# Patient Record
Sex: Male | Born: 1970
Health system: Southern US, Community
[De-identification: ages and names within clinical notes are randomized; demographics above are authoritative.]

## PROBLEM LIST (undated history)

## (undated) DIAGNOSIS — Z87442 Personal history of urinary calculi: Secondary | ICD-10-CM

## (undated) DIAGNOSIS — D689 Coagulation defect, unspecified: Secondary | ICD-10-CM

## (undated) HISTORY — PX: EYE SURGERY: SHX253

## (undated) HISTORY — DX: Coagulation defect, unspecified: D68.9

---

## 1994-03-19 HISTORY — PX: EYE SURGERY: SHX253

## 2007-05-03 ENCOUNTER — Emergency Department (HOSPITAL_COMMUNITY): Admission: EM | Admit: 2007-05-03 | Discharge: 2007-05-03 | Payer: Self-pay | Admitting: Family Medicine

## 2007-05-06 ENCOUNTER — Emergency Department (HOSPITAL_COMMUNITY): Admission: EM | Admit: 2007-05-06 | Discharge: 2007-05-06 | Payer: Self-pay | Admitting: Emergency Medicine

## 2007-07-30 ENCOUNTER — Emergency Department (HOSPITAL_COMMUNITY): Admission: EM | Admit: 2007-07-30 | Discharge: 2007-07-30 | Payer: Self-pay | Admitting: Family Medicine

## 2010-12-08 LAB — I-STAT 8, (EC8 V) (CONVERTED LAB)
Chloride: 107
Glucose, Bld: 105 — ABNORMAL HIGH
HCT: 45
pCO2, Ven: 38 — ABNORMAL LOW
pH, Ven: 7.422 — ABNORMAL HIGH

## 2010-12-08 LAB — CULTURE, ROUTINE-ABSCESS: Gram Stain: NONE SEEN

## 2010-12-08 LAB — DIFFERENTIAL
Eosinophils Absolute: 0.1
Lymphocytes Relative: 40
Lymphs Abs: 3.1
Neutrophils Relative %: 51

## 2010-12-08 LAB — POCT I-STAT CREATININE: Creatinine, Ser: 1

## 2010-12-08 LAB — CBC
Platelets: 242
WBC: 7.7

## 2011-06-04 ENCOUNTER — Telehealth: Payer: Self-pay

## 2011-06-04 NOTE — Telephone Encounter (Signed)
Patient saw Dr. Milus Glazier a few months ago, received some suggestions from him as far as treatment concerned. Was told to call Dr. Elbert Ewings. back and he would further advise him. Requests call from Dr. Elbert Ewings.

## 2011-06-04 NOTE — Telephone Encounter (Signed)
Pt states that you could give him something else to help jump off wt loss.  He was taking the Wellbutrin and he felt that it did not work for him.  Please advise.  Chart is in your box in Dr lounge if you needed.

## 2011-06-05 NOTE — Telephone Encounter (Signed)
I don't have a chart on him recently for weight loss.  Please pull his written chart

## 2011-06-08 NOTE — Telephone Encounter (Signed)
CHART IS IN YOUR BOX IN THE DR [place]

## 2011-08-08 ENCOUNTER — Ambulatory Visit: Payer: Self-pay | Admitting: Family Medicine

## 2011-08-08 VITALS — BP 124/90 | HR 68 | Temp 98.5°F | Resp 18 | Ht 71.25 in | Wt 309.8 lb

## 2011-08-08 DIAGNOSIS — I10 Essential (primary) hypertension: Secondary | ICD-10-CM

## 2011-08-08 MED ORDER — PROPRANOLOL HCL 20 MG PO TABS: 20.0000 mg | ORAL_TABLET | Freq: Two times a day (BID) | ORAL | Status: DC

## 2011-08-08 NOTE — Progress Notes (Signed)
  Patient Name: Steven Hudson Date of Birth: 1971-02-08 Medical Record Number: [id number] Gender: male Date of Encounter: 08/08/2011  History of Present Illness:  JJESUS DINGLEY is a 41 y.o. very pleasant male patient who presents with the following:  Here with concern about his BP.  He has noted that she sometimes feels flushed and nervous- can have some panic type symptoms.  When he feels this way he will check his BP and note that it may be 150/ 95- 100.  When he is able to settle down and feels better his BP will return to normal.   No exertional problems or symptoms- had an episode today while he was driving though.   He notes symptoms every few days to every couple of days.  Worse recently due to stress he thinks.  Notes increased stress at work recently.   He did take a xanax a couple of weeks ago and felt better.   No CP or palpitations, no CAD history.  He does not feel depressed or notice other severe symptoms of anxiety.  Mother with history of HTN.  Brother with HTN.    There is no problem list on file for this patient.  No past medical history on file. No past surgical history on file. History  Substance Use Topics  . Smoking status: Never Smoker   . Smokeless tobacco: Former Neurosurgeon    Types: Chew  . Alcohol Use: Not on file   No family history on file. Allergies no known allergies  Medication list has been reviewed and updated.  Review of Systems: As per HPI- otherwise negative.   Physical Examination: Filed Vitals:   08/08/11 1750 08/08/11 1800  BP: 120/80 124/90  Pulse: 68   Temp: 98.5 F (36.9 C)   TempSrc: Oral   Resp: 18   Height: 5' 11.25" (1.81 m)   Weight: 309 lb 12.8 oz (140.524 kg)   SpO2: 98%     Body mass index is 42.91 kg/(m^2).  GEN: WDWN, NAD, Non-toxic, A & O x 3, obese HEENT: Atraumatic, Normocephalic. Neck supple. No masses, No LAD. Ears and Nose: No external deformity. CV: RRR, No M/G/R. No JVD. No thrill. No extra heart  sounds. PULM: CTA B, no wheezes, crackles, rhonchi. No retractions. No resp. distress. No accessory muscle use. ABD: S, NT, ND, +BS. No rebound. No HSM. EXTR: No c/c/e NEURO Normal gait.  PSYCH: Normally interactive. Conversant. Not depressed or anxious appearing.  Calm demeanor.   EKG: NSR, no ST elevation or depression.  No change from older EKG  Assessment and Plan: 1. HTN (hypertension)  propranolol (INDERAL) 20 MG tablet   Will try a low dose of interal to see if it might help with likely anxiety- related BP changes and ?panic attacks.  He will let us know how this does by phone in a few days- Sooner if worse.   If his symptoms persist we can do further labs, TSH, etc- for now he wishes to delay as his health insurance is in flux.  If he develops any CP or other worrisome symptoms get help right away

## 2011-08-10 ENCOUNTER — Encounter: Payer: Self-pay | Admitting: Family Medicine

## 2012-01-24 ENCOUNTER — Ambulatory Visit (INDEPENDENT_AMBULATORY_CARE_PROVIDER_SITE_OTHER): Payer: BC Managed Care – PPO | Admitting: Family Medicine

## 2012-01-24 VITALS — BP 130/92 | HR 101 | Temp 98.2°F | Resp 18 | Ht 71.5 in | Wt 309.8 lb

## 2012-01-24 DIAGNOSIS — F329 Major depressive disorder, single episode, unspecified: Secondary | ICD-10-CM

## 2012-01-24 DIAGNOSIS — H00019 Hordeolum externum unspecified eye, unspecified eyelid: Secondary | ICD-10-CM

## 2012-01-24 MED ORDER — POLYMYXIN B-TRIMETHOPRIM 10000-0.1 UNIT/ML-% OP SOLN: 1.0000 [drp] | OPHTHALMIC | Status: DC

## 2012-01-24 MED ORDER — BUPROPION HCL ER (SR) 150 MG PO TB12: 150.0000 mg | ORAL_TABLET | Freq: Two times a day (BID) | ORAL | Status: DC

## 2012-01-24 NOTE — Patient Instructions (Addendum)
Apply hot compresses to your eye frequently. If it is not better in the next couple of days let me know- Sooner if worse.   Take the wellbutrin just once a day for 3 days- then twice a day.  Give me a call if you do not feel that it is helpful for you

## 2012-01-24 NOTE — Progress Notes (Signed)
[facility] [address] [place] [address] [phone]  Date:  01/24/2012   Name:  Steven Hudson   DOB:  [birth year]   MRN:  [id number]  PCP:  Tally Due, MD    Chief Complaint: Eye Pain   History of Present Illness:  Steven Hudson is a 41 y.o. very pleasant male patient who presents with the following:  He has noted some pain and swelling in his right lower eye lid- this has been present for about 3 days. He now notes a bump and a "pus head." It will drain some and leave some crust in the am.  Vision seems to be ok and he does not have any pain in the globe.  He did have a right eye injury in the 1997- a piece of wire hurt his eye.  He had surgery and recovered.   He does not wear corrective lenses.  Otherwise he is feeling ok.    He tried wellbutrin for a couple of weeks in the past- it did not seem to make any difference but he might like to start back on this. He thinks maybe he did not take enough or take it for enough time.  He does not feel depressed but also thinks that his mood is not as good as it could be.  He can feel unmotivated and have a hard time focusing.  No SI.   His mother died earlier this year and he is still mourning her loss fairly acutely.    He is not taking inderall any more- he no longer needs it except for very occasionally.  There is no problem list on file for this patient.   History reviewed. No pertinent past medical history.  Past Surgical History  Procedure Date  . Eye surgery     History  Substance Use Topics  . Smoking status: Never Smoker   . Smokeless tobacco: Former Neurosurgeon    Types: Chew  . Alcohol Use: Not on file    Family History  Problem Relation Age of Onset  . Hypertension Mother   . Leukemia Mother   . Diabetes Father   . Heart disease Maternal Grandmother   . Heart disease Maternal Grandfather   . Diabetes Paternal Grandfather     No Known Allergies  Medication list has been reviewed  and updated.  Current Outpatient Prescriptions on File Prior to Visit  Medication Sig Dispense Refill  . propranolol (INDERAL) 20 MG tablet Take 1 tablet (20 mg total) by mouth 2 (two) times daily.  60 tablet  1    Review of Systems:  As per HPI- otherwise negative.   Physical Examination: Filed Vitals:   01/24/12 1413  BP: 130/92  Pulse: 101  Temp: 98.2 F (36.8 C)  Resp: 18   Filed Vitals:   01/24/12 1413  Height: 5' 11.5" (1.816 m)  Weight: 309 lb 12.8 oz (140.524 kg)   Body mass index is 42.61 kg/(m^2). Ideal Body Weight: Weight in (lb) to have BMI = 25: 181.4   GEN: WDWN, NAD, Non-toxic, A & O x 3, obese HEENT: Atraumatic, Normocephalic. Neck supple. No masses, No LAD. Bilateral TM wnl, oropharynx normal.  PEERL,EOMI.  Right lower lid has a visible stye.  Position does not allow for pus to be expressed.    Fundoscopic exam wnl.  Conjunctivae benign.  Ears and Nose: No external deformity. CV: RRR, No M/G/R. No JVD. No thrill. No extra heart sounds.  PULM: CTA B, no wheezes, crackles, rhonchi. No retractions. No resp. distress. No accessory muscle use. EXTR: No c/c/e NEURO Normal gait.  PSYCH: Normally interactive. Conversant. Not depressed or anxious appearing.  Calm demeanor.    Assessment and Plan: 1. Stye  trimethoprim-polymyxin b (POLYTRIM) ophthalmic solution  2. Depression  buPROPion (WELLBUTRIN SR) 150 MG 12 hr tablet   Aggressive hot compresses and polytrim drops as he has noted some am crusting of his eye as well.   Will try wellbutrin again- he will take 150 mg but try increasing to BID after a few days.   See patient instructions for more details.     Abbe Amsterdam, MD

## 2012-01-28 NOTE — Progress Notes (Signed)
Completed prior auth for pt's bupropion SR 150 BID over the phone and received approval from 12/29/11 - 01/27/13. Faxed approval notice to pharmacy.

## 2012-02-05 ENCOUNTER — Telehealth: Payer: Self-pay

## 2012-02-05 NOTE — Telephone Encounter (Signed)
Left message for him, to call me back about this, or call the pharmacy to see what he was previously taking. He is advised to call me back.

## 2012-02-05 NOTE — Telephone Encounter (Signed)
Pt is concerned and would like to know if the rx for welbutrin is correct he was prescribed xl 150 mg when he saw Dr L and just recently Dr. Patsy Lager did rx for sr 150mg  he wants to make sure he taking the right dosage of medication because the pills look totally different. [phone]

## 2012-02-07 NOTE — Telephone Encounter (Signed)
Left message for him to call me back if the pharmacy has not taken care of this.

## 2012-05-20 ENCOUNTER — Telehealth: Payer: Self-pay

## 2012-05-20 NOTE — Telephone Encounter (Signed)
PATIENT WANTS THIS MESSAGE TO GO TO DR. Patsy Lager. SHE PRESCRIBED HIM WELLBUTRIN TO CALM HIM DOWN. HE STATES IT IS NOT WORKING FOR HIM. HE SAID HE HAS GONE TO COUNSELING, AND THE DOCTOR SUGGESTED HE CALL BACK HERE TO GET HIS MEDICINE CHANGED TO SOMETHING ELSE. BEST PHONE [phone] (CELL)   PHARMACY CHOICE IS [facility] ON [address].   MBC

## 2012-05-21 NOTE — Telephone Encounter (Signed)
Ok, I am glad he has been going to counseling, as I have not seen him since November please ask him to come and see me this week- I will be in Thursday and Friday if one of those days would work for him

## 2012-05-21 NOTE — Telephone Encounter (Signed)
Patient will follow up either Thursday or Friday with you.

## 2012-05-22 ENCOUNTER — Ambulatory Visit (INDEPENDENT_AMBULATORY_CARE_PROVIDER_SITE_OTHER): Payer: BC Managed Care – PPO | Admitting: Family Medicine

## 2012-05-22 VITALS — BP 130/90 | HR 65 | Temp 97.9°F | Resp 16 | Ht 71.5 in | Wt 316.0 lb

## 2012-05-22 DIAGNOSIS — F909 Attention-deficit hyperactivity disorder, unspecified type: Secondary | ICD-10-CM

## 2012-05-22 LAB — COMPREHENSIVE METABOLIC PANEL
ALT: 32 U/L (ref 0–53)
AST: 24 U/L (ref 0–37)
Albumin: 4.7 g/dL (ref 3.5–5.2)
BUN: 12 mg/dL (ref 6–23)
Calcium: 9.8 mg/dL (ref 8.4–10.5)
Chloride: 101 mEq/L (ref 96–112)
Potassium: 4.3 mEq/L (ref 3.5–5.3)
Sodium: 140 mEq/L (ref 135–145)
Total Protein: 7.4 g/dL (ref 6.0–8.3)

## 2012-05-22 LAB — TSH: TSH: 2.131 u[IU]/mL (ref 0.350–4.500)

## 2012-05-22 MED ORDER — AMPHETAMINE-DEXTROAMPHETAMINE 5 MG PO TABS: 5.0000 mg | ORAL_TABLET | Freq: Two times a day (BID) | ORAL | Status: DC

## 2012-05-22 NOTE — Patient Instructions (Addendum)
We are going to start adderall 5mg  twice a day.  Keep an eye on your symptoms, as well as your blood pressure and appetite/ sleep.  If your blood pressure starts going above 140/90 please let me know.  Otherwise, please contact me in 7 to 10 days with an update, and we can increase your adderall if needed

## 2012-05-22 NOTE — Progress Notes (Signed)
[facility] [address], [place] [address] [phone]  Date:  05/22/2012   Name:  Steven Hudson   DOB:  Dec 21, 1970   MRN:  [id number]  PCP:  Tally Due, MD    Chief Complaint: Medicine changes   History of Present Illness:  Steven Hudson is a 42 y.o. very pleasant male patient who presents with the following:  He is here to recheck depression.  I saw him in November of last year and we started wellbutrin- he was mourning his mother at that time.  I was called regarding continued depression, but he states he is not actually depressed anymore- his therapist said he might have adult ADHD and suggested adderall.    He has not been diagnosed with or treated for ADD in the past.  His son does take adderall, and he notes similar behaviors in himself.  He notes that it is hard for him to get things done in a timely manner, he tends to put things off.  He sometimes does not meet his goals at work and feels he does not perform up to his potential.  At home he has a hard time getting focused on tasks and completing tasks.  He did "okay" in school, but "did not pay attention well."    He is sleeping ok, his appetite is ok- "probably too good."  He would like to try a diet of some sort.  He would like to lose some weight  He has not taken wellburtin in a week or so.  He feels like doing things, does not have anhedonia.  He does not feel that he is depressed, "my mood is good, I'm a happy guy"  There is no problem list on file for this patient.   History reviewed. No pertinent past medical history.  Past Surgical History  Procedure Laterality Date  . Eye surgery      History  Substance Use Topics  . Smoking status: Never Smoker   . Smokeless tobacco: Former Neurosurgeon    Types: Chew  . Alcohol Use: Yes    Family History  Problem Relation Age of Onset  . Hypertension Mother   . Leukemia Mother   . Diabetes Father   . Heart disease Maternal Grandmother    . Heart disease Maternal Grandfather   . Diabetes Paternal Grandfather     No Known Allergies  Medication list has been reviewed and updated.  Current Outpatient Prescriptions on File Prior to Visit  Medication Sig Dispense Refill  . buPROPion (WELLBUTRIN SR) 150 MG 12 hr tablet Take 1 tablet (150 mg total) by mouth 2 (two) times daily.  60 tablet  6  . propranolol (INDERAL) 20 MG tablet Take 1 tablet (20 mg total) by mouth 2 (two) times daily.  60 tablet  1  . trimethoprim-polymyxin b (POLYTRIM) ophthalmic solution Place 1 drop into the right eye every 4 (four) hours.  10 mL  0   No current facility-administered medications on file prior to visit.    Review of Systems:  As per HPI- otherwise negative.   Physical Examination: Filed Vitals:   05/22/12 1142  BP: 140/70  Pulse: 65  Temp: 97.9 F (36.6 C)  Resp: 16   Filed Vitals:   05/22/12 1142  Height: 5' 11.5" (1.816 m)  Weight: 316 lb (143.337 kg)   Body mass index is 43.46 kg/(m^2). Ideal Body Weight: Weight in (lb) to have BMI = 25: 181.4  GEN: WDWN, NAD, Non-toxic, A & O x 3, obese HEENT: Atraumatic, Normocephalic. Neck supple. No masses, No LAD. Ears and Nose: No external deformity. CV: RRR, No M/G/R. No JVD. No thrill. No extra heart sounds. PULM: CTA B, no wheezes, crackles, rhonchi. No retractions. No resp. distress. No accessory muscle use. EXTR: No c/c/e NEURO Normal gait.  PSYCH: Normally interactive. Conversant. Not depressed or anxious appearing.  Calm demeanor.   [name] completed the adult ADHD questionnaire today- his score was 41 which is well into the "likely to have ADHD" category Assessment and Plan: Fatigue - Plan: TSH, Comprehensive metabolic panel  Adult ADHD - Plan: amphetamine-dextroamphetamine (ADDERALL) 5 MG tablet  [name] is here today with concern regariding ADHD.  His symptoms and ADHD survery are consistent with ADHD, and I think he is at low risk of abusing stimulants.  Our main  concern is his BP, which is borderline but stable from his visit in the fall.  We will cautiously start a low dose of adderall and see how he does.  He has a home BP cuff and will watch his BP.  Gave an rx for 120 adderall 5mg , as I suspect we will go up to 10 mg BID.  He will start with 5mg  qd or BID, per his preferance.    Abbe Amsterdam, MD

## 2012-05-23 ENCOUNTER — Encounter: Payer: Self-pay | Admitting: Family Medicine

## 2012-05-24 ENCOUNTER — Telehealth: Payer: Self-pay

## 2012-05-24 NOTE — Telephone Encounter (Signed)
PT SAW [place] FOR A REFILL ON ADDERALL ON 05-22-12.  THE PHARMACY HAS SENT A REQUEST FOR A PRE AUTHORIZATION ON THE REFILL TWICE AND HAS NOT HEARD FROM [place].  THE PT IS GOING OUT OF TOWN TOMORROW AND NEEDS TO GET THIS REFILLED BEFORE THEN.  PLEASE CALL [phone]

## 2012-05-26 NOTE — Telephone Encounter (Signed)
Patient is requesting a TSH #3 be added to his labs for testing   [phone]

## 2012-05-26 NOTE — Telephone Encounter (Signed)
x

## 2012-05-28 NOTE — Progress Notes (Signed)
Completed a prior auth for pt's Adderall 5 mg and received approval through 05/28/13. Faxed approval notice to pharmacy.

## 2013-07-28 ENCOUNTER — Telehealth: Payer: Self-pay

## 2013-07-28 NOTE — Telephone Encounter (Signed)
Pt called, has an expires prescription for amphetamine salts , and would like to know if Dr. Patsy Lageropland could authorize new prescription.  [phone]

## 2013-07-28 NOTE — Telephone Encounter (Signed)
Called and LMOM.   I will be in the office the next 3 days- if he can bring in the expired rx I will see what I can do.

## 2013-07-29 ENCOUNTER — Other Ambulatory Visit: Payer: Self-pay | Admitting: Family Medicine

## 2013-07-29 ENCOUNTER — Encounter: Payer: Self-pay | Admitting: Family Medicine

## 2013-07-29 ENCOUNTER — Ambulatory Visit (INDEPENDENT_AMBULATORY_CARE_PROVIDER_SITE_OTHER): Payer: BC Managed Care – PPO | Admitting: Family Medicine

## 2013-07-29 VITALS — BP 118/72 | HR 93 | Temp 98.1°F | Resp 18

## 2013-07-29 DIAGNOSIS — F909 Attention-deficit hyperactivity disorder, unspecified type: Secondary | ICD-10-CM

## 2013-07-29 DIAGNOSIS — F988 Other specified behavioral and emotional disorders with onset usually occurring in childhood and adolescence: Secondary | ICD-10-CM

## 2013-07-29 MED ORDER — AMPHETAMINE-DEXTROAMPHETAMINE 5 MG PO TABS: 5.0000 mg | ORAL_TABLET | Freq: Two times a day (BID) | ORAL | Status: DC

## 2013-07-29 NOTE — Progress Notes (Signed)
[facility] [address], [place] [address] [phone]  Date:  07/29/2013   Name: [facility] Steven EthJason E Mcwilliams   DOB:  [birth year]   MRN:  [id number]  PCP:  Tally DueGUEST, CHRIS WARREN, MD    Chief Complaint: No chief complaint on file.   History of Present Illness:  Steven Hudson is a 43 y.o. very pleasant male patient who presents with the following:  Here today to discuss his ADHD. He was seen for this just over a year ago and we started on adderall 5mg  twice a day.  This seemed to be working well for him, but he eventually stopped taking it and his medication is now expired.  He is here today because he would like to start back on this medication.  He did not notice any adverse effects and did feel that it helped him to concentrate, especially on his tasks at work  Patient Active Problem List   Diagnosis Date Noted  . Obesity, unspecified 05/22/2012  . Adult ADHD 05/22/2012    History reviewed. No pertinent past medical history.  Past Surgical History  Procedure Laterality Date  . Eye surgery      History  Substance Use Topics  . Smoking status: Never Smoker   . Smokeless tobacco: Former NeurosurgeonUser    Types: Chew  . Alcohol Use: Yes    Family History  Problem Relation Age of Onset  . Hypertension Mother   . Leukemia Mother   . Diabetes Father   . Heart disease Maternal Grandmother   . Heart disease Maternal Grandfather   . Diabetes Paternal Grandfather     No Known Allergies  Medication list has been reviewed and updated.  Current Outpatient Prescriptions on File Prior to Visit  Medication Sig Dispense Refill  . amphetamine-dextroamphetamine (ADDERALL) 5 MG tablet Take 1 tablet (5 mg total) by mouth 2 (two) times daily. To fill 08/27/13  60 tablet  0  . trimethoprim-polymyxin b (POLYTRIM) ophthalmic solution Place 1 drop into the right eye every 4 (four) hours.  10 mL  0   No current facility-administered medications on file prior to visit.    Review of  Systems:  As per HPI- otherwise negative.   Physical Examination: Filed Vitals:   07/29/13 1029  BP: 118/72  Pulse: 93  Temp: 98.1 F (36.7 C)  Resp: 18   There were no vitals filed for this visit. There is no weight on file to calculate BMI. Ideal Body Weight:    GEN: WDWN, NAD, Non-toxic, A & O x 3, obese, looks well HEENT: Atraumatic, Normocephalic. Neck supple. No masses, No LAD. Ears and Nose: No external deformity. CV: RRR, No M/G/R. No JVD. No thrill. No extra heart sounds. PULM: CTA B, no wheezes, crackles, rhonchi. No retractions. No resp. distress. No accessory muscle use. EXTR: No c/c/e NEURO Normal gait.  PSYCH: Normally interactive. Conversant. Not depressed or anxious appearing.  Calm demeanor.    Assessment and Plan: ADD (attention deficit disorder)  Given 2 months of adderall 5mg  BID today He will let me know when he needs more or if he has any other problems in the meantime   Signed Abbe AmsterdamJessica Trennon Torbeck, MD

## 2013-09-21 ENCOUNTER — Ambulatory Visit (INDEPENDENT_AMBULATORY_CARE_PROVIDER_SITE_OTHER): Payer: BC Managed Care – PPO | Admitting: Family Medicine

## 2013-09-21 ENCOUNTER — Encounter: Payer: Self-pay | Admitting: Family Medicine

## 2013-09-21 VITALS — BP 118/88 | HR 92 | Temp 98.6°F | Resp 16 | Ht 71.5 in | Wt 302.4 lb

## 2013-09-21 DIAGNOSIS — E785 Hyperlipidemia, unspecified: Secondary | ICD-10-CM

## 2013-09-21 DIAGNOSIS — R42 Dizziness and giddiness: Secondary | ICD-10-CM

## 2013-09-21 DIAGNOSIS — Z1329 Encounter for screening for other suspected endocrine disorder: Secondary | ICD-10-CM

## 2013-09-21 DIAGNOSIS — L659 Nonscarring hair loss, unspecified: Secondary | ICD-10-CM

## 2013-09-21 DIAGNOSIS — R17 Unspecified jaundice: Secondary | ICD-10-CM

## 2013-09-21 LAB — COMPREHENSIVE METABOLIC PANEL
ALT: 34 U/L (ref 0–53)
AST: 26 U/L (ref 0–37)
Albumin: 4.7 g/dL (ref 3.5–5.2)
Alkaline Phosphatase: 57 U/L (ref 39–117)
BUN: 13 mg/dL (ref 6–23)
CALCIUM: 9.7 mg/dL (ref 8.4–10.5)
CHLORIDE: 101 meq/L (ref 96–112)
CO2: 25 meq/L (ref 19–32)
CREATININE: 0.95 mg/dL (ref 0.50–1.35)
GLUCOSE: 91 mg/dL (ref 70–99)
Potassium: 4.2 mEq/L (ref 3.5–5.3)
Sodium: 138 mEq/L (ref 135–145)
Total Bilirubin: 1 mg/dL (ref 0.2–1.2)
Total Protein: 7.5 g/dL (ref 6.0–8.3)

## 2013-09-21 LAB — CBC
HEMATOCRIT: 44.5 % (ref 39.0–52.0)
HEMOGLOBIN: 16 g/dL (ref 13.0–17.0)
MCH: 29.5 pg (ref 26.0–34.0)
MCHC: 36 g/dL (ref 30.0–36.0)
MCV: 82 fL (ref 78.0–100.0)
Platelets: 247 10*3/uL (ref 150–400)
RBC: 5.43 MIL/uL (ref 4.22–5.81)
RDW: 13.2 % (ref 11.5–15.5)
WBC: 7.4 10*3/uL (ref 4.0–10.5)

## 2013-09-21 LAB — LIPID PANEL
CHOLESTEROL: 230 mg/dL — AB (ref 0–200)
HDL: 54 mg/dL (ref >39)
LDL Cholesterol: 158 mg/dL — ABNORMAL HIGH (ref 0–99)
TRIGLYCERIDES: 90 mg/dL (ref <150)
Total CHOL/HDL Ratio: 4.3 Ratio
VLDL: 18 mg/dL (ref 0–40)

## 2013-09-21 LAB — T3, FREE: T3 FREE: 3.4 pg/mL (ref 2.3–4.2)

## 2013-09-21 LAB — TSH: TSH: 1.85 u[IU]/mL (ref 0.350–4.500)

## 2013-09-21 NOTE — Progress Notes (Addendum)
[facility] [address], [place] [address] [phone]  Date:  09/21/2013   Name:  Steven Hudson   DOB:  December 25, 1970   MRN:  [id number]  PCP:  Tally DueGUEST, CHRIS WARREN, MD    Chief Complaint: yellowing of the tongue x 6 mos and labs   History of Present Illness:  Steven EthJason E Pamintuan is a 43 y.o. very pleasant male patient who presents with the following:  Here today with a concern regarding his tongue.  His wife is here with him today  He has noted a yellow color of his tongue that comes and goes.  Also, sometimes his eyes will appear yellow.  They have noted this for about 6 months, it may occur "constantly," but will wax and wane.   His wife does feel that he looks yellow at times- however she is not sure if this just is his complexion.    He is NOT fasting today but would like to do some labs, would like a cholesterol screen He also has also notes some unusual symptoms on occasion when he presses on his head (like uses his head to get out of bed), or when he extends his neck all the way.  This just occurs sporadically and he is not sure if he should be concerned.  No numbness or weakness in his body  He also notes some loss of leg hair on his bilateral lower legs- not sure if this is just due to wearing long pants  Patient Active Problem List   Diagnosis Date Noted  . Obesity, unspecified 05/22/2012  . Adult ADHD 05/22/2012    No past medical history on file.  Past Surgical History  Procedure Laterality Date  . Eye surgery      History  Substance Use Topics  . Smoking status: Never Smoker   . Smokeless tobacco: Former NeurosurgeonUser    Types: Chew  . Alcohol Use: Yes    Family History  Problem Relation Age of Onset  . Hypertension Mother   . Leukemia Mother   . Diabetes Father   . Heart disease Maternal Grandmother   . Heart disease Maternal Grandfather   . Diabetes Paternal Grandfather     No Known Allergies  Medication list has been reviewed and  updated.  Current Outpatient Prescriptions on File Prior to Visit  Medication Sig Dispense Refill  . amphetamine-dextroamphetamine (ADDERALL) 5 MG tablet Take 1 tablet (5 mg total) by mouth 2 (two) times daily. To fill 08/27/13  60 tablet  0  . trimethoprim-polymyxin b (POLYTRIM) ophthalmic solution Place 1 drop into the right eye every 4 (four) hours.  10 mL  0   No current facility-administered medications on file prior to visit.    Review of Systems:  As per HPI- otherwise negative.   Physical Examination: Filed Vitals:   09/21/13 1022  BP: 118/88  Pulse: 92  Temp: 98.6 F (37 C)  Resp: 16   Filed Vitals:   09/21/13 1022  Height: 5' 11.5" (1.816 m)  Weight: 302 lb 6.4 oz (137.168 kg)   Body mass index is 41.59 kg/(m^2). Ideal Body Weight: Weight in (lb) to have BMI = 25: 181.4  GEN: WDWN, NAD, Non-toxic, A & O x 3, obese, looks well HEENT: Atraumatic, Normocephalic. Neck supple. No masses, No LAD. Bilateral TM wnl, oropharynx normal.  PEERL,EOMI.  He has slight build- up on his tongue, but no true jaundice noted.  He does have a more yellow/  golden skin tone on the tanned areas.  However the untanned skin on his abdomen appears normal, pinkish undertones. I do not see appreciable jaundice of his eyes Ears and Nose: No external deformity. CV: RRR, No M/G/R. No JVD. No thrill. No extra heart sounds. PULM: CTA B, no wheezes, crackles, rhonchi. No retractions. No resp. distress. No accessory muscle use. ABD: S, NT, ND EXTR: No c/c/e NEURO Normal gait.  PSYCH: Normally interactive. Conversant. Not depressed or anxious appearing.  Calm demeanor.  He has loss of leg hair on the bilateral lateal shins.  Normal perfusion and hair on feet, strong pedal pulses  Assessment and Plan: Jaundice - Plan: CBC, Comprehensive metabolic panel  Screening for hypothyroidism - Plan: TSH, T3, Free  Other and unspecified hyperlipidemia - Plan: Lipid panel  Patchy loss of hair - Plan: TSH,  T3, Free  Vertigo - Plan: US Carotid Duplex Bilateral  Uncertain if pt truly has had jaundice, will check labs as above Other labs pending Refer for a carotid doppler due to strange sx with neck extension   Signed Abbe AmsterdamJessica Eleesha Purkey, MD  7/7: received labs, called pt and LMOM- no evidence of hepatic dysfunction.  Will send letter Placed order for a future liver function panel- he will try to come in to have this done the next time he seems to be more jaundiced.     Results for orders placed in visit on 09/21/13  CBC      Result Value Ref Range   WBC 7.4  4.0 - 10.5 K/uL   RBC 5.43  4.22 - 5.81 MIL/uL   Hemoglobin 16.0  13.0 - 17.0 g/dL   HCT 29.544.5  62.139.0 - 30.852.0 %   MCV 82.0  78.0 - 100.0 fL   MCH 29.5  26.0 - 34.0 pg   MCHC 36.0  30.0 - 36.0 g/dL   RDW 65.713.2  84.611.5 - 96.215.5 %   Platelets 247  150 - 400 K/uL  COMPREHENSIVE METABOLIC PANEL      Result Value Ref Range   Sodium 138  135 - 145 mEq/L   Potassium 4.2  3.5 - 5.3 mEq/L   Chloride 101  96 - 112 mEq/L   CO2 25  19 - 32 mEq/L   Glucose, Bld 91  70 - 99 mg/dL   BUN 13  6 - 23 mg/dL   Creat 9.520.95  8.410.50 - 3.241.35 mg/dL   Total Bilirubin 1.0  0.2 - 1.2 mg/dL   Alkaline Phosphatase 57  39 - 117 U/L   AST 26  0 - 37 U/L   ALT 34  0 - 53 U/L   Total Protein 7.5  6.0 - 8.3 g/dL   Albumin 4.7  3.5 - 5.2 g/dL   Calcium 9.7  8.4 - 40.110.5 mg/dL  TSH      Result Value Ref Range   TSH 1.850  0.350 - 4.500 uIU/mL  LIPID PANEL      Result Value Ref Range   Cholesterol 230 (*) 0 - 200 mg/dL   Triglycerides 90  <027<150 mg/dL   HDL 54  >25>39 mg/dL   Total CHOL/HDL Ratio 4.3     VLDL 18  0 - 40 mg/dL   LDL Cholesterol 366158 (*) 0 - 99 mg/dL  T3, FREE      Result Value Ref Range   T3, Free 3.4  2.3 - 4.2 pg/mL

## 2013-09-21 NOTE — Patient Instructions (Signed)
We will set up an ultrasound of your carotid arteries to rule- out any blockage there.  Otherwise unless the unusual feeling you have noted in your neck get worse or more persistent I would just continue to keep an eye on this. I will be in touch with your labs asap

## 2013-09-22 ENCOUNTER — Encounter: Payer: Self-pay | Admitting: Family Medicine

## 2013-09-22 NOTE — Addendum Note (Signed)
Addended by: Abbe AmsterdamOPLAND, Valarie Farace C on: 09/22/2013 11:42 AM   Modules accepted: Orders

## 2014-11-23 ENCOUNTER — Encounter: Payer: Self-pay | Admitting: Family Medicine

## 2016-01-06 DIAGNOSIS — R05 Cough: Secondary | ICD-10-CM | POA: Diagnosis not present

## 2016-01-06 DIAGNOSIS — J0100 Acute maxillary sinusitis, unspecified: Secondary | ICD-10-CM | POA: Diagnosis not present

## 2016-03-14 DIAGNOSIS — J029 Acute pharyngitis, unspecified: Secondary | ICD-10-CM | POA: Diagnosis not present

## 2016-03-14 DIAGNOSIS — H66001 Acute suppurative otitis media without spontaneous rupture of ear drum, right ear: Secondary | ICD-10-CM | POA: Diagnosis not present

## 2016-03-14 DIAGNOSIS — R05 Cough: Secondary | ICD-10-CM | POA: Diagnosis not present

## 2016-08-26 ENCOUNTER — Emergency Department (HOSPITAL_BASED_OUTPATIENT_CLINIC_OR_DEPARTMENT_OTHER)
Admission: EM | Admit: 2016-08-26 | Discharge: 2016-08-26 | Disposition: A | Discharge status: Home / Self Care | Payer: BLUE CROSS/BLUE SHIELD | Attending: Emergency Medicine | Admitting: Emergency Medicine

## 2016-08-26 ENCOUNTER — Emergency Department (HOSPITAL_BASED_OUTPATIENT_CLINIC_OR_DEPARTMENT_OTHER): Payer: BLUE CROSS/BLUE SHIELD

## 2016-08-26 ENCOUNTER — Encounter (HOSPITAL_BASED_OUTPATIENT_CLINIC_OR_DEPARTMENT_OTHER): Payer: Self-pay | Admitting: Emergency Medicine

## 2016-08-26 DIAGNOSIS — Z87891 Personal history of nicotine dependence: Secondary | ICD-10-CM | POA: Diagnosis not present

## 2016-08-26 DIAGNOSIS — S61310A Laceration without foreign body of right index finger with damage to nail, initial encounter: Secondary | ICD-10-CM | POA: Insufficient documentation

## 2016-08-26 DIAGNOSIS — Z23 Encounter for immunization: Secondary | ICD-10-CM | POA: Diagnosis not present

## 2016-08-26 DIAGNOSIS — Y93H2 Activity, gardening and landscaping: Secondary | ICD-10-CM | POA: Insufficient documentation

## 2016-08-26 DIAGNOSIS — W293XXA Contact with powered garden and outdoor hand tools and machinery, initial encounter: Secondary | ICD-10-CM | POA: Insufficient documentation

## 2016-08-26 DIAGNOSIS — Y92017 Garden or yard in single-family (private) house as the place of occurrence of the external cause: Secondary | ICD-10-CM | POA: Diagnosis not present

## 2016-08-26 DIAGNOSIS — S6991XA Unspecified injury of right wrist, hand and finger(s), initial encounter: Secondary | ICD-10-CM | POA: Diagnosis not present

## 2016-08-26 DIAGNOSIS — Y999 Unspecified external cause status: Secondary | ICD-10-CM | POA: Insufficient documentation

## 2016-08-26 MED ORDER — TETANUS-DIPHTH-ACELL PERTUSSIS 5-2.5-18.5 LF-MCG/0.5 IM SUSP
0.5000 mL | Freq: Once | INTRAMUSCULAR | Status: AC
  Administered 2016-08-26: 0.5 mL via INTRAMUSCULAR
  Filled 2016-08-26: qty 0.5

## 2016-08-26 MED ORDER — CEPHALEXIN 500 MG PO CAPS: 500.0000 mg | ORAL_CAPSULE | Freq: Two times a day (BID) | ORAL | 0 refills | Status: AC

## 2016-08-26 MED ORDER — IBUPROFEN 800 MG PO TABS: 800.0000 mg | ORAL_TABLET | Freq: Three times a day (TID) | ORAL | 0 refills | Status: DC | PRN

## 2016-08-26 MED ORDER — LIDOCAINE HCL (PF) 1 % IJ SOLN
5.0000 mL | Freq: Once | INTRAMUSCULAR | Status: AC
  Administered 2016-08-26: 5 mL
  Filled 2016-08-26: qty 5

## 2016-08-26 NOTE — ED Triage Notes (Signed)
Patient reports that he cut his finger on a hedgeclipper  - 2 lacerations noted to the tip of his right index finger

## 2016-08-26 NOTE — Discharge Instructions (Signed)
You were seen in the ED today with a finger laceration. We repaired this with glue which will come off in 7-10 days. Take the antibiotics as directed to avoid any infection. Return to the ED with any finger redness, swelling, or severe pain. Otherwise you can follow with your PCP in the coming week.

## 2016-08-26 NOTE — ED Provider Notes (Signed)
Emergency Department Provider Note  By signing my name below, I, Deland Pretty, attest that this documentation has been prepared under the direction and in the presence of Trase Bunda, Arlyss Repress, MD. Electronically Signed: Deland Pretty, ED Scribe. 08/26/16. 4:58 PM.  I have reviewed the triage vital signs and the nursing notes.   HISTORY  Chief Complaint Finger Injury   HPI Comments: Steven Hudson is a 46 y.o. male who presents to the Emergency Department complaining of moderate, gradually worsening right index finger pain with associated wound that occurred today. The pt reports that he was doing yardw work when he "clipped" the skin at the tip of his finger with electric hedge clippers. The pt denies numbness and fever. Tetanus not UTD. No radiation of pain. No numbness to the finger tip.    History reviewed. No pertinent past medical history.  Patient Active Problem List   Diagnosis Date Noted  . Obesity, unspecified 05/22/2012  . Adult ADHD 05/22/2012    Past Surgical History:  Procedure Laterality Date  . EYE SURGERY      Current Outpatient Rx  . Order #: [id number] Class: Print  . Order #: [id number] Class: Print  . Order #: [id number] Class: Print  . Order #: [id number] Class: Normal    Allergies Patient has no known allergies.  Family History  Problem Relation Age of Onset  . Hypertension Mother   . Leukemia Mother   . Diabetes Father   . Heart disease Maternal Grandmother   . Heart disease Maternal Grandfather   . Diabetes Paternal Grandfather     Social History Social History  Substance Use Topics  . Smoking status: Never Smoker  . Smokeless tobacco: Former Neurosurgeon    Types: Chew  . Alcohol use Yes    Review of Systems  Constitutional: No fever/chills Eyes: No visual changes. ENT: No sore throat. Cardiovascular: Denies chest pain. Respiratory: Denies shortness of breath. Gastrointestinal: No abdominal pain.  No nausea, no vomiting.  No diarrhea.  No  constipation. Genitourinary: Negative for dysuria. Musculoskeletal: Negative for back pain. Skin: Negative for rash. Positive for wound. Neurological: Negative for headaches, focal weakness or numbness.  10-point ROS otherwise negative.  ____________________________________________   PHYSICAL EXAM:  VITAL SIGNS: ED Triage Vitals  Enc Vitals Group     BP 08/26/16 1643 (!) 148/101     Pulse Rate 08/26/16 1643 66     Resp 08/26/16 1643 18     Temp 08/26/16 1647 98.5 F (36.9 C)     Temp Source 08/26/16 1647 Oral     SpO2 08/26/16 1643 100 %     Weight 08/26/16 1644 (!) 320 lb (145.2 kg)     Height 08/26/16 1644 6' (1.829 m)     Pain Score 08/26/16 1643 7   Constitutional: Alert and oriented. Well appearing and in no acute distress. Eyes: Conjunctivae are normal. Head: Atraumatic. Nose: No congestion/rhinnorhea. Mouth/Throat: Mucous membranes are moist.  Neck: No stridor. Cardiovascular: Normal rate, regular rhythm. Good peripheral circulation. Grossly normal heart sounds.   Respiratory: Normal respiratory effort.  No retractions. Lungs CTAB. Gastrointestinal: Soft and nontender. No distention.  Musculoskeletal: No lower extremity tenderness nor edema. No gross deformities of extremities. Neurologic:  Normal speech and language. No gross focal neurologic deficits are appreciated.  Skin:  Skin is warm, dry. Two, superficial, 2cm and 1cm lacerations to the distal right index finger. Small nail laceration but does not extend to the germinal matrix. Normal ROM of the finger. Normal sensation.   ____________________________________________  DIAGNOSTIC STUDIES: Oxygen Saturation is 100% on RA, normal by my interpretation.   COORDINATION OF CARE: 4:58 PM-Discussed next steps with pt. Pt verbalized understanding and is agreeable with the plan.   ____________________________________________  RADIOLOGY  Dg Hand Complete Right  Result Date: 08/26/2016 CLINICAL DATA:   Laceration tip of the right index finger with hedge clippers today. Initial encounter. EXAM: RIGHT HAND - COMPLETE 3+ VIEW COMPARISON:  Or FINDINGS: No acute bony or joint abnormality is identified. No radiopaque foreign body. Remote tuft fracture of the right Iver Miklas finger is noted. Subtle laceration along the distal aspect of the index finger is seen. Mild cortical irregularity of the base of the proximal phalanx of the index finger is most consistent with old trauma. IMPRESSION: Laceration index finger without foreign body or acute bony abnormality. Electronically Signed   By: Drusilla Kannerhomas  Dalessio M.D.   On: 08/26/2016 17:55    ____________________________________________   PROCEDURES  Procedure(s) performed:   Marland Kitchen.Marland Kitchen.Laceration Repair Date/Time: 08/27/2016 10:05 AM Performed by: Daneen Volcy, Arlyss RepressJOSHUA G Authorized by: Maia PlanLONG, Copelyn Widmer G   Consent:    Consent obtained:  Verbal   Consent given by:  Parent   Risks discussed:  Infection, poor cosmetic result, pain, retained foreign body, tendon damage, vascular damage, nerve damage, need for additional repair and poor wound healing   Alternatives discussed:  No treatment Anesthesia (see MAR for exact dosages):    Anesthesia method:  Nerve block   Block needle gauge:  25 G   Block anesthetic:  Lidocaine 1% w/o epi   Block technique:  Digital    Block injection procedure:  Introduced needle   Block outcome:  Anesthesia achieved Laceration details:    Location:  Finger   Finger location:  R index finger   Length (cm):  2 Repair type:    Repair type:  Simple Pre-procedure details:    Preparation:  Patient was prepped and draped in usual sterile fashion and imaging obtained to evaluate for foreign bodies Exploration:    Hemostasis achieved with:  Direct pressure   Wound exploration: entire depth of wound probed and visualized     Wound extent: no foreign bodies/material noted, no nerve damage noted, no underlying fracture noted and no vascular damage noted       Contaminated: no   Treatment:    Area cleansed with:  Betadine   Amount of cleaning:  Standard   Irrigation solution:  Sterile saline   Visualized foreign bodies/material removed: no   Skin repair:    Repair method:  Tissue adhesive Approximation:    Approximation:  Close   Vermilion border: well-aligned   Post-procedure details:    Dressing:  Open (no dressing)   Patient tolerance of procedure:  Tolerated well, no immediate complications  .Marland Kitchen.Laceration Repair Date/Time: 08/27/2016 10:13 AM Performed by: Juliana Boling, Arlyss RepressJOSHUA G Authorized by: Maia PlanLONG, Curlie Sittner G   Consent:    Consent obtained:  Verbal   Consent given by:  Patient   Risks discussed:  Infection, pain, retained foreign body, poor cosmetic result, vascular damage, poor wound healing, nerve damage and need for additional repair   Alternatives discussed:  No treatment Anesthesia (see MAR for exact dosages):    Anesthesia method:  Nerve block   Block anesthetic:  Lidocaine 1% w/o epi   Block technique:  Digital   Block injection procedure:  Introduced needle   Block outcome:  Anesthesia achieved Laceration details:    Location:  Finger   Finger location:  R index finger  Length (cm):  1 Repair type:    Repair type:  Simple Pre-procedure details:    Preparation:  Imaging obtained to evaluate for foreign bodies Exploration:    Wound extent: no foreign bodies/material noted, no nerve damage noted, no tendon damage noted, no underlying fracture noted and no vascular damage noted     Contaminated: no   Treatment:    Area cleansed with:  Betadine   Amount of cleaning:  Standard   Irrigation solution:  Sterile saline   Visualized foreign bodies/material removed: no   Skin repair:    Repair method:  Tissue adhesive Approximation:    Approximation:  Close   Vermilion border: well-aligned   Post-procedure details:    Dressing:  Open (no dressing)   Patient tolerance of procedure:  Tolerated well, no immediate  complications     ____________________________________________   INITIAL IMPRESSION / ASSESSMENT AND PLAN / ED COURSE  Pertinent labs & imaging results that were available during my care of the patient were reviewed by me and considered in my medical decision making (see chart for details).  Patient presents to the ED with two shallow right index finger lacerations. No extension into the germinal matrix of the nail bed. No fracture. Lacerations cleaned extensively and well aligned with minimal tension. Will repair with tissue adhesive.   06:37 PM Lacerations repaired to right index finger as above. Starting Keflex and discussed return precautions for infection.   At this time, I do not feel there is any life-threatening condition present. I have reviewed and discussed all results (EKG, imaging, lab, urine as appropriate), exam findings with patient. I have reviewed nursing notes and appropriate previous records.  I feel the patient is safe to be discharged home without further emergent workup. Discussed usual and customary return precautions. Patient and family (if present) verbalize understanding and are comfortable with this plan.  Patient will follow-up with their primary care provider. If they do not have a primary care provider, information for follow-up has been provided to them. All questions have been answered.  ____________________________________________  FINAL CLINICAL IMPRESSION(S) / ED DIAGNOSES  Final diagnoses:  Laceration of right index finger without foreign body with damage to nail, initial encounter     MEDICATIONS GIVEN DURING THIS VISIT:  Medications  lidocaine (PF) (XYLOCAINE) 1 % injection 5 mL (5 mLs Infiltration Given 08/26/16 1706)  Tdap (BOOSTRIX) injection 0.5 mL (0.5 mLs Intramuscular Given 08/26/16 1707)     NEW OUTPATIENT MEDICATIONS STARTED DURING THIS VISIT:  Discharge Medication List as of 08/26/2016  6:39 PM    START taking these medications    Details  cephALEXin (KEFLEX) 500 MG capsule Take 1 capsule (500 mg total) by mouth 2 (two) times daily., Starting Sun 08/26/2016, Until Sun 09/02/2016, Print    ibuprofen (ADVIL,MOTRIN) 800 MG tablet Take 1 tablet (800 mg total) by mouth every 8 (eight) hours as needed., Starting Sun 08/26/2016, Print       I personally performed the services described in this documentation, which was scribed in my presence. The recorded information has been reviewed and is accurate.    Note:  This document was prepared using Dragon voice recognition software and may include unintentional dictation errors.  Alona Bene, MD Emergency Medicine    Shalie Schremp, Arlyss Repress, MD 08/27/16 1016

## 2017-10-05 ENCOUNTER — Encounter: Payer: Self-pay | Admitting: Physician Assistant

## 2017-10-05 ENCOUNTER — Other Ambulatory Visit: Payer: Self-pay

## 2017-10-05 ENCOUNTER — Ambulatory Visit (INDEPENDENT_AMBULATORY_CARE_PROVIDER_SITE_OTHER): Payer: BLUE CROSS/BLUE SHIELD | Admitting: Physician Assistant

## 2017-10-05 ENCOUNTER — Telehealth: Payer: Self-pay | Admitting: Physician Assistant

## 2017-10-05 VITALS — BP 131/84 | HR 69 | Resp 16 | Ht 71.5 in | Wt 317.8 lb

## 2017-10-05 DIAGNOSIS — R079 Chest pain, unspecified: Secondary | ICD-10-CM

## 2017-10-05 DIAGNOSIS — S139XXA Sprain of joints and ligaments of unspecified parts of neck, initial encounter: Secondary | ICD-10-CM | POA: Diagnosis not present

## 2017-10-05 NOTE — Telephone Encounter (Signed)
Got A call from Kirsten at [facility] - treating patient for cervical radiculopathy and he is complaining of left chest pain - he will come at 2pm today for an ECG and evaluation

## 2017-10-05 NOTE — Progress Notes (Signed)
Steven EthJason E Hudson  MRN: [id number] DOB: 11-18-1970  PCP: Shade FloodGreene, Jeffrey R, MD  Chief Complaint  Patient presents with  . Chest Pain    per patient had sensation across LEFT side of the chest - this morning    Subjective:  Pt presents to clinic for left sided pain with movement of his left arm once during his xray at the ortho office.  It was a pulling sensation that resolved when he moved his arm.  He has no associatedSOB, sweating or nausea. He is physically active and has had no CP or SOB during activity.  He has not had that pain since the episode during the xray. His grandfather had an MI in his 6050s no other family hisotry of cardiac issues known to patient.  History is obtained by patient.  Review of Systems  Constitutional: Negative for chills and fever.  Cardiovascular: Positive for chest pain (1 episode - sec - pulling sensation without SOB or nausea or sweating).    Patient Active Problem List   Diagnosis Date Noted  . Obesity, unspecified 05/22/2012  . Adult ADHD 05/22/2012    No current outpatient medications on file prior to visit.   No current facility-administered medications on file prior to visit.     No Known Allergies  No past medical history on file. Social History   Social History Narrative  . Not on file   Social History   Tobacco Use  . Smoking status: Never Smoker  . Smokeless tobacco: Former NeurosurgeonUser    Types: Chew  Substance Use Topics  . Alcohol use: Yes  . Drug use: No   family history includes Diabetes in his father and paternal grandfather; Heart disease in his maternal grandfather and maternal grandmother; Hypertension in his mother; Leukemia in his mother.     Objective:  BP 131/84 (BP Location: Right Arm)   Pulse 69   Resp 16   Ht 5' 11.5" (1.816 m)   Wt (!) 317 lb 12.8 oz (144.2 kg)   SpO2 97%   BMI 43.71 kg/m  Body mass index is 43.71 kg/m.  Wt Readings from Last 3 Encounters:  10/05/17 (!) 317 lb 12.8 oz (144.2 kg)    08/26/16 (!) 320 lb (145.2 kg)  09/21/13 (!) 302 lb 6.4 oz (137.2 kg)    Physical Exam  Constitutional: He is oriented to person, place, and time. He appears well-developed and well-nourished.  HENT:  Head: Normocephalic and atraumatic.  Right Ear: External ear normal.  Left Ear: External ear normal.  Eyes: Conjunctivae are normal.  Neck: Normal range of motion.  Cardiovascular: Normal rate, regular rhythm and normal heart sounds.  No murmur heard. Pulmonary/Chest: Effort normal and breath sounds normal. He has no wheezes.  Neurological: He is alert and oriented to person, place, and time.  Skin: Skin is warm and dry.  Psychiatric: Judgment normal.  Vitals reviewed.   Assessment and Plan :  Left-sided chest pain - Plan: EKG 12-Lead - resolved now - likely MSK related to his cerival radiculopathy that he woke up with this am and he has been walking around with his left arm over his head or neck pain relief.  Warning signs given to patient.  Encouraged pt to make an appt for CPE as he had not had one in years.  Patient verbalized to me that they understand the following: diagnosis, what is being done for them, what to expect and what should be done at home.  Their questions have  been answered.  See after visit summary for patient specific instructions.  Benny Lennert PA-C  Primary Care at [facility] 10/05/2017 2:24 PM  Please note: Portions of this report may have been transcribed using dragon voice recognition software. Every effort was made to ensure accuracy; however, inadvertent computerized transcription errors may be present.

## 2017-10-05 NOTE — Patient Instructions (Addendum)
  Your EKG looks good today.   IF you received an x-ray today, [facility] will [facility] an [facility]. Please contact [facility] at [phone] with questions or concerns regarding your invoice.   IF you received labwork today, you will receive an invoice from [facility]. Please contact [facility] at [phone] with questions or concerns regarding your invoice.   Our billing staff will not be able to assist you with questions regarding bills from these companies.  You will be contacted with the lab results as soon as they are available. The fastest way to get your results is to activate your My Chart account. Instructions are located on the last page of this paperwork. If you have not heard from us regarding the results in 2 weeks, please contact this office.

## 2017-10-08 ENCOUNTER — Ambulatory Visit: Payer: Self-pay | Admitting: Physician Assistant

## 2017-10-15 ENCOUNTER — Other Ambulatory Visit: Payer: Self-pay | Admitting: Orthopedic Surgery

## 2017-10-15 DIAGNOSIS — M542 Cervicalgia: Secondary | ICD-10-CM

## 2017-10-22 ENCOUNTER — Ambulatory Visit
Admission: RE | Admit: 2017-10-22 | Discharge: 2017-10-22 | Disposition: A | Discharge status: Home / Self Care | Payer: BLUE CROSS/BLUE SHIELD | Source: Ambulatory Visit | Attending: Orthopedic Surgery | Admitting: Orthopedic Surgery

## 2017-10-22 ENCOUNTER — Other Ambulatory Visit: Payer: Self-pay | Admitting: Orthopedic Surgery

## 2017-10-22 DIAGNOSIS — M542 Cervicalgia: Secondary | ICD-10-CM

## 2017-10-22 DIAGNOSIS — Z77018 Contact with and (suspected) exposure to other hazardous metals: Secondary | ICD-10-CM

## 2017-10-22 DIAGNOSIS — Z135 Encounter for screening for eye and ear disorders: Secondary | ICD-10-CM | POA: Diagnosis not present

## 2017-10-22 DIAGNOSIS — M4802 Spinal stenosis, cervical region: Secondary | ICD-10-CM | POA: Diagnosis not present

## 2017-10-24 ENCOUNTER — Telehealth: Payer: Self-pay | Admitting: Family Medicine

## 2017-10-24 DIAGNOSIS — M5136 Other intervertebral disc degeneration, lumbar region: Secondary | ICD-10-CM | POA: Diagnosis not present

## 2017-10-25 ENCOUNTER — Encounter: Payer: BLUE CROSS/BLUE SHIELD | Admitting: Family Medicine

## 2017-11-08 DIAGNOSIS — M542 Cervicalgia: Secondary | ICD-10-CM | POA: Diagnosis not present

## 2017-11-08 DIAGNOSIS — M5020 Other cervical disc displacement, unspecified cervical region: Secondary | ICD-10-CM | POA: Diagnosis not present

## 2017-11-08 DIAGNOSIS — M5030 Other cervical disc degeneration, unspecified cervical region: Secondary | ICD-10-CM | POA: Diagnosis not present

## 2017-11-08 DIAGNOSIS — M5412 Radiculopathy, cervical region: Secondary | ICD-10-CM | POA: Diagnosis not present

## 2017-11-08 DIAGNOSIS — M4722 Other spondylosis with radiculopathy, cervical region: Secondary | ICD-10-CM | POA: Diagnosis not present

## 2017-12-20 DIAGNOSIS — M5412 Radiculopathy, cervical region: Secondary | ICD-10-CM | POA: Diagnosis not present

## 2017-12-20 DIAGNOSIS — M5030 Other cervical disc degeneration, unspecified cervical region: Secondary | ICD-10-CM | POA: Diagnosis not present

## 2017-12-20 DIAGNOSIS — M5020 Other cervical disc displacement, unspecified cervical region: Secondary | ICD-10-CM | POA: Diagnosis not present

## 2017-12-20 DIAGNOSIS — M4722 Other spondylosis with radiculopathy, cervical region: Secondary | ICD-10-CM | POA: Diagnosis not present

## 2018-01-09 NOTE — Telephone Encounter (Signed)
DONE

## 2018-01-29 DIAGNOSIS — M4722 Other spondylosis with radiculopathy, cervical region: Secondary | ICD-10-CM | POA: Diagnosis not present

## 2018-01-29 DIAGNOSIS — M5020 Other cervical disc displacement, unspecified cervical region: Secondary | ICD-10-CM | POA: Diagnosis not present

## 2018-01-29 DIAGNOSIS — M5030 Other cervical disc degeneration, unspecified cervical region: Secondary | ICD-10-CM | POA: Diagnosis not present

## 2018-01-29 DIAGNOSIS — M5412 Radiculopathy, cervical region: Secondary | ICD-10-CM | POA: Diagnosis not present

## 2018-02-18 ENCOUNTER — Ambulatory Visit (INDEPENDENT_AMBULATORY_CARE_PROVIDER_SITE_OTHER): Payer: BLUE CROSS/BLUE SHIELD | Admitting: Family Medicine

## 2018-02-18 ENCOUNTER — Other Ambulatory Visit: Payer: Self-pay

## 2018-02-18 ENCOUNTER — Encounter: Payer: Self-pay | Admitting: Family Medicine

## 2018-02-18 ENCOUNTER — Telehealth: Payer: Self-pay | Admitting: Family Medicine

## 2018-02-18 VITALS — BP 126/80 | HR 69 | Temp 97.9°F | Ht 72.0 in | Wt 329.0 lb

## 2018-02-18 DIAGNOSIS — Z131 Encounter for screening for diabetes mellitus: Secondary | ICD-10-CM

## 2018-02-18 DIAGNOSIS — Z0000 Encounter for general adult medical examination without abnormal findings: Secondary | ICD-10-CM

## 2018-02-18 DIAGNOSIS — L659 Nonscarring hair loss, unspecified: Secondary | ICD-10-CM

## 2018-02-18 DIAGNOSIS — E785 Hyperlipidemia, unspecified: Secondary | ICD-10-CM | POA: Diagnosis not present

## 2018-02-18 DIAGNOSIS — Z0001 Encounter for general adult medical examination with abnormal findings: Secondary | ICD-10-CM | POA: Diagnosis not present

## 2018-02-18 DIAGNOSIS — Z8349 Family history of other endocrine, nutritional and metabolic diseases: Secondary | ICD-10-CM

## 2018-02-18 DIAGNOSIS — R454 Irritability and anger: Secondary | ICD-10-CM | POA: Diagnosis not present

## 2018-02-18 DIAGNOSIS — Z130 Encounter for screening for diseases of the blood and blood-forming organs and certain disorders involving the immune mechanism: Secondary | ICD-10-CM

## 2018-02-18 NOTE — Telephone Encounter (Unsigned)
Copied from CRM [phone]. Topic: General - Other >> Feb 18, 2018 10:41 AM Lynne LoganHudson, Caryn D wrote: Reason for CRM: Pt stated he had lab work done today 02/18/18. He would like to know of his testosterone levels can also be checked from samples taken. Please advise.

## 2018-02-18 NOTE — Progress Notes (Signed)
Subjective:    Patient ID: Steven Hudson, male    DOB: Oct 26, 1970, 47 y.o.   MRN: [id number]  HPI Steven Hudson is a 47 y.o. male Presents today for: Chief Complaint  Patient presents with  . general check up    request a CPE   New patient to me, here for physical/checkup as well as other concerns.   Obesity: Body mass index is 44.62 kg/m. Wt Readings from Last 3 Encounters:  02/18/18 (!) 329 lb (149.2 kg)  10/05/17 (!) 317 lb 12.8 oz (144.2 kg)  08/26/16 (!) 320 lb (145.2 kg)  father with hx of diabetes, both parents deceased.   Hyperlipidemia: Tested 4 years ago. Maternal GF with CAD in 59's.  Lab Results  Component Value Date   CHOL 230 (H) 09/21/2013   HDL 54 09/21/2013   LDLCALC 158 (H) 09/21/2013   TRIG 90 09/21/2013   CHOLHDL 4.3 09/21/2013   Eyebrow thinning: Noticed few years. mom had thyroid disease. Lower leg hair has also thinned out. Only has leg swelling with flying.   Cancer screening: Family history of leukemia in his mother.  Immunizations: Immunization History  Administered Date(s) Administered  . Tdap 08/26/2016  declines flu shot today  Depression screening: Depression screen [facility] 2/9 02/18/2018 10/05/2017 09/21/2013  Decreased Interest 0 0 0  Down, Depressed, Hopeless 0 0 0  PHQ - 2 Score 0 0 0  has had some mood swings. Has blowups at times then gets better, but family is affected by it. Displeasure with overweight or stressed. Times of yelling. Overall happy, but gets frustrated easily, impatient. Daily little stuff irritates him.  No regular exercise. Not golfing (helped with stress in past).  No regular stress mgt techniques right now. Had been on Adderall in past for focus. Not on meds for years.   Vision:  Visual Acuity Screening   Right eye Left eye Both eyes  Without correction: 20/15-1 20/15-2 20/13-1  With correction:     no glasses/contacts.   Exercise: No regular exercise.   Dentist: Going tomorrow.    Patient Active  Problem List   Diagnosis Date Noted  . Obesity, unspecified 05/22/2012  . Adult ADHD 05/22/2012   History reviewed. No pertinent past medical history. Past Surgical History:  Procedure Laterality Date  . EYE SURGERY     No Known Allergies Prior to Admission medications   Not on File   Social History   Socioeconomic History  . Marital status: Married    Spouse name: Not on file  . Number of children: Not on file  . Years of education: Not on file  . Highest education level: Not on file  Occupational History  . Not on file  Social Needs  . Financial resource strain: Not on file  . Food insecurity:    Worry: Not on file    Inability: Not on file  . Transportation needs:    Medical: Not on file    Non-medical: Not on file  Tobacco Use  . Smoking status: Never Smoker  . Smokeless tobacco: Former Systems developer    Types: Chew  Substance and Sexual Activity  . Alcohol use: Yes  . Drug use: No  . Sexual activity: Yes  Lifestyle  . Physical activity:    Days per week: Not on file    Minutes per session: Not on file  . Stress: Not on file  Relationships  . Social connections:    Talks on phone: Not on file  Gets together: Not on file    Attends religious service: Not on file    Active member of club or organization: Not on file    Attends meetings of clubs or organizations: Not on file    Relationship status: Not on file  . Intimate partner violence:    Fear of current or ex partner: Not on file    Emotionally abused: Not on file    Physically abused: Not on file    Forced sexual activity: Not on file  Other Topics Concern  . Not on file  Social History Narrative  . Not on file    Review of Systems     Objective:   Physical Exam  Constitutional: He is oriented to person, place, and time. He appears well-developed and well-nourished.  HENT:  Head: Normocephalic and atraumatic.  Right Ear: External ear normal.  Left Ear: External ear normal.  Mouth/Throat:  Oropharynx is clear and moist.  Eyes: Pupils are equal, round, and reactive to light. Conjunctivae and EOM are normal.  Neck: Normal range of motion. Neck supple. No thyromegaly present.  Cardiovascular: Normal rate, regular rhythm, normal heart sounds and intact distal pulses.  Pulmonary/Chest: Effort normal and breath sounds normal. No respiratory distress. He has no wheezes.  Abdominal: Soft. He exhibits no distension. There is no tenderness.  Musculoskeletal: Normal range of motion. He exhibits no edema or tenderness.  Lymphadenopathy:    He has no cervical adenopathy.  Neurological: He is alert and oriented to person, place, and time. He has normal reflexes.  Skin: Skin is warm and dry.  Psychiatric: He has a normal mood and affect. His behavior is normal.  Vitals reviewed.  Vitals:   02/18/18 0818 02/18/18 0827  BP: (!) 143/79 126/80  Pulse: 69   Temp: 97.9 F (36.6 C)   TempSrc: Oral   SpO2: 98%   Weight: (!) 329 lb (149.2 kg)   Height: 6' (1.829 m)        Assessment & Plan:   Steven Hudson is a 47 y.o. male Annual physical exam - Plan: Testosterone  - -anticipatory guidance as below in AVS, screening labs above. Health maintenance items as above in HPI discussed/recommended as applicable.   -Exercise discussed for weight loss as well as stress management.   Hair thinning - Plan: TSH + free T4, T3, Free Family history of thyroid disease - Plan: TSH + free T4, T3, Free  -Screen for thyroid disease, requested T4/T3.  Follow-up to discuss hair thinning further if testing normal.  Did note some lower leg hair loss, differential includes PVD but asymptomatic otherwise.  Consider ABI testing.  Screening, anemia, deficiency, iron - Plan: CBC  Irritability - Plan: TSH + free T4  -Check TSH, but suspect component of stress/irritability.  Stress management techniques discussed, consider counseling, hold on meds for now.  -Recheck to discuss further in 6  weeks  Hyperlipidemia, unspecified hyperlipidemia type - Plan: Lipid panel, Comprehensive metabolic panel  -Check labs, decide on ASCVD risk for statins  Screening for diabetes mellitus - Plan: Hemoglobin A1c   No orders of the defined types were placed in this encounter.  Patient Instructions   See information below on stress and stress management.  I would like to discuss his symptoms further in the next 6 weeks.  Additionally I will provide a phone number below if you would like to meet with a therapist or counselor.  Some form of exercise such as walking or other low intensity  exercise most days per week may also be helpful for both weight and mood symptoms.  I will check thyroid testing, but can discuss eyebrow thinning and lower leg hair thinning further next time.  Thank you for coming in today.  [facility]: [phone]   Keeping you healthy  Get these tests  Blood pressure- Have your blood pressure checked once a year by your healthcare provider.  Normal blood pressure is 120/80.  Weight- Have your body mass index (BMI) calculated to screen for obesity.  BMI is a measure of body fat based on height and weight. You can also calculate your own BMI at [employer].it.  Cholesterol- Have your cholesterol checked regularly starting at age 37, sooner may be necessary if you have diabetes, high blood pressure, if a family member developed heart diseases at an early age or if you smoke.   Chlamydia, HIV, and other sexual transmitted disease- Get screened each year until the age of 72 then within three months of each new sexual partner.  Diabetes- Have your blood sugar checked regularly if you have high blood pressure, high cholesterol, a family history of diabetes or if you are overweight.  Get these vaccines  Flu shot- Every fall.  Tetanus shot- Every 10 years.  Menactra- Single dose; prevents meningitis.  Take these steps  Don't smoke-  If you do smoke, ask your healthcare provider about quitting. For tips on how to quit, go to www.smokefree.gov or call 1-800-QUIT-NOW.  Be physically active- Exercise 5 days a week for at least 30 minutes.  If you are not already physically active start slow and gradually work up to 30 minutes of moderate physical activity.  Examples of moderate activity include walking briskly, mowing the yard, dancing, swimming bicycling, etc.  Eat a healthy diet- Eat a variety of healthy foods such as fruits, vegetables, low fat milk, low fat cheese, yogurt, lean meats, poultry, fish, beans, tofu, etc.  For more information on healthy eating, go to www.thenutritionsource.org  Drink alcohol in moderation- Limit alcohol intake two drinks or less a day.  Never drink and drive.  Dentist- Brush and floss teeth twice daily; visit your dentis twice a year.  Depression-Your emotional health is as important as your physical health.  If you're feeling down, losing interest in things you normally enjoy please talk with your healthcare provider.  Gun Safety- If you keep a gun in your home, keep it unloaded and with the safety lock on.  Bullets should be stored separately.  Helmet use- Always wear a helmet when riding a motorcycle, bicycle, rollerblading or skateboarding.  Safe sex- If you may be exposed to a sexually transmitted infection, use a condom  Seat belts- Seat bels can save your life; always wear one.  Smoke/Carbon Monoxide detectors- These detectors need to be installed on the appropriate level of your home.  Replace batteries at least once a year.  Skin Cancer- When out in the sun, cover up and use sunscreen SPF 15 or higher.  Violence- If anyone is threatening or hurting you, please tell your healthcare provider.  Stress and Stress Management Stress is a normal reaction to life events. It is what you feel when life demands more than you are used to or more than you can handle. Some stress can be  useful. For example, the stress reaction can help you catch the last bus of the day, study for a test, or meet a deadline at work. But stress that occurs too often or for  too long can cause problems. It can affect your emotional health and interfere with relationships and normal daily activities. Too much stress can weaken your immune system and increase your risk for physical illness. If you already have a medical problem, stress can make it worse. What are the causes? All sorts of life events may cause stress. An event that causes stress for one person may not be stressful for another person. Major life events commonly cause stress. These may be positive or negative. Examples include losing your job, moving into a new home, getting married, having a baby, or losing a loved one. Less obvious life events may also cause stress, especially if they occur day after day or in combination. Examples include working long hours, driving in traffic, caring for children, being in debt, or being in a difficult relationship. What are the signs or symptoms? Stress may cause emotional symptoms including, the following:  Anxiety. This is feeling worried, afraid, on edge, overwhelmed, or out of control.  Anger. This is feeling irritated or impatient.  Depression. This is feeling sad, down, helpless, or guilty.  Difficulty focusing, remembering, or making decisions.  Stress may cause physical symptoms, including the following:  Aches and pains. These may affect your head, neck, back, stomach, or other areas of your body.  Tight muscles or clenched jaw.  Low energy or trouble sleeping.  Stress may cause unhealthy behaviors, including the following:  Eating to feel better (overeating) or skipping meals.  Sleeping too little, too much, or both.  Working too much or putting off tasks (procrastination).  Smoking, drinking alcohol, or using drugs to feel better.  How is this diagnosed? Stress is diagnosed  through an assessment by your health care provider. Your health care provider will ask questions about your symptoms and any stressful life events.Your health care provider will also ask about your medical history and may order blood tests or other tests. Certain medical conditions and medicine can cause physical symptoms similar to stress. Mental illness can cause emotional symptoms and unhealthy behaviors similar to stress. Your health care provider may refer you to a mental health professional for further evaluation. How is this treated? Stress management is the recommended treatment for stress.The goals of stress management are reducing stressful life events and coping with stress in healthy ways. Techniques for reducing stressful life events include the following:  Stress identification. Self-monitor for stress and identify what causes stress for you. These skills may help you to avoid some stressful events.  Time management. Set your priorities, keep a calendar of events, and learn to say "no." These tools can help you avoid making too many commitments.  Techniques for coping with stress include the following:  Rethinking the problem. Try to think realistically about stressful events rather than ignoring them or overreacting. Try to find the positives in a stressful situation rather than focusing on the negatives.  Exercise. Physical exercise can release both physical and emotional tension. The key is to find a form of exercise you enjoy and do it regularly.  Relaxation techniques. These relax the body and mind. Examples include yoga, meditation, tai chi, biofeedback, deep breathing, progressive muscle relaxation, listening to music, being out in nature, journaling, and other hobbies. Again, the key is to find one or more that you enjoy and can do regularly.  Healthy lifestyle. Eat a balanced diet, get plenty of sleep, and do not smoke. Avoid using alcohol or drugs to relax.  Strong  support network. Spend time with  family, friends, or other people you enjoy being around.Express your feelings and talk things over with someone you trust.  Counseling or talktherapy with a mental health professional may be helpful if you are having difficulty managing stress on your own. Medicine is typically not recommended for the treatment of stress.Talk to your health care provider if you think you need medicine for symptoms of stress. Follow these instructions at home:  Keep all follow-up visits as directed by your health care provider.  Take all medicines as directed by your health care provider. Contact a health care provider if:  Your symptoms get worse or you start having new symptoms.  You feel overwhelmed by your problems and can no longer manage them on your own. Get help right away if:  You feel like hurting yourself or someone else. This information is not intended to replace advice given to you by your health care provider. Make sure you discuss any questions you have with your health care provider. Document Released: 08/29/2000 Document Revised: 08/11/2015 Document Reviewed: 10/28/2012 Elsevier Interactive Patient Education  [employer].     If you have lab work done today you will be contacted with your lab results within the next 2 weeks.  If you have not heard from [place] then please contact us. The fastest way to get your results is to register for My Chart.     IF you received an x-ray today, you will receive an invoice from [facility]. Please contact [facility] at [phone] with questions or concerns regarding your invoice.   IF you received labwork today, you will receive an invoice from [facility]. Please contact [facility] at [phone] with questions or concerns regarding your invoice.   Our billing staff will not be able to assist you with questions regarding bills from these companies.  You will be contacted with the lab results  as soon as they are available. The fastest way to get your results is to activate your My Chart account. Instructions are located on the last page of this paperwork. If you have not heard from [place] regarding the results in 2 weeks, please contact this office.       Signed,   Merri Ray, MD [facility].  02/19/18 9:15 AM

## 2018-02-18 NOTE — Telephone Encounter (Signed)
Order placed

## 2018-02-18 NOTE — Patient Instructions (Addendum)
See information below on stress and stress management.  I would like to discuss his symptoms further in the next 6 weeks.  Additionally I will provide a phone number below if you would like to meet with a therapist or counselor.  Some form of exercise such as walking or other low intensity exercise most days per week may also be helpful for both weight and mood symptoms.  I will check thyroid testing, but can discuss eyebrow thinning and lower leg hair thinning further next time.  Thank you for coming in today.  [facility]: [phone]   Keeping you healthy  Get these tests  Blood pressure- Have your blood pressure checked once a year by your healthcare provider.  Normal blood pressure is 120/80.  Weight- Have your body mass index (BMI) calculated to screen for obesity.  BMI is a measure of body fat based on height and weight. You can also calculate your own BMI at www.nhlbisupport.com/bmi/.  Cholesterol- Have your cholesterol checked regularly starting at age 35, sooner may be necessary if you have diabetes, high blood pressure, if a family member developed heart diseases at an early age or if you smoke.   Chlamydia, HIV, and other sexual transmitted disease- Get screened each year until the age of 25 then within three months of each new sexual partner.  Diabetes- Have your blood sugar checked regularly if you have high blood pressure, high cholesterol, a family history of diabetes or if you are overweight.  Get these vaccines  Flu shot- Every fall.  Tetanus shot- Every 10 years.  Menactra- Single dose; prevents meningitis.  Take these steps  Don't smoke- If you do smoke, ask your healthcare provider about quitting. For tips on how to quit, go to www.smokefree.gov or call 1-800-QUIT-NOW.  Be physically active- Exercise 5 days a week for at least 30 minutes.  If you are not already physically active start slow and gradually work up to 30 minutes of moderate  physical activity.  Examples of moderate activity include walking briskly, mowing the yard, dancing, swimming bicycling, etc.  Eat a healthy diet- Eat a variety of healthy foods such as fruits, vegetables, low fat milk, low fat cheese, yogurt, lean meats, poultry, fish, beans, tofu, etc.  For more information on healthy eating, go to www.thenutritionsource.org  Drink alcohol in moderation- Limit alcohol intake two drinks or less a day.  Never drink and drive.  Dentist- Brush and floss teeth twice daily; visit your dentis twice a year.  Depression-Your emotional health is as important as your physical health.  If you're feeling down, losing interest in things you normally enjoy please talk with your healthcare provider.  Gun Safety- If you keep a gun in your home, keep it unloaded and with the safety lock on.  Bullets should be stored separately.  Helmet use- Always wear a helmet when riding a motorcycle, bicycle, rollerblading or skateboarding.  Safe sex- If you may be exposed to a sexually transmitted infection, use a condom  Seat belts- Seat bels can save your life; always wear one.  Smoke/Carbon Monoxide detectors- These detectors need to be installed on the appropriate level of your home.  Replace batteries at least once a year.  Skin Cancer- When out in the sun, cover up and use sunscreen SPF 15 or higher.  Violence- If anyone is threatening or hurting you, please tell your healthcare provider.  Stress and Stress Management Stress is a normal reaction to life events. It is what you feel   when life demands more than you are used to or more than you can handle. Some stress can be useful. For example, the stress reaction can help you catch the last bus of the day, study for a test, or meet a deadline at work. But stress that occurs too often or for too long can cause problems. It can affect your emotional health and interfere with relationships and normal daily activities. Too much stress  can weaken your immune system and increase your risk for physical illness. If you already have a medical problem, stress can make it worse. What are the causes? All sorts of life events may cause stress. An event that causes stress for one person may not be stressful for another person. Major life events commonly cause stress. These may be positive or negative. Examples include losing your job, moving into a new home, getting married, having a baby, or losing a loved one. Less obvious life events may also cause stress, especially if they occur day after day or in combination. Examples include working long hours, driving in traffic, caring for children, being in debt, or being in a difficult relationship. What are the signs or symptoms? Stress may cause emotional symptoms including, the following:  Anxiety. This is feeling worried, afraid, on edge, overwhelmed, or out of control.  Anger. This is feeling irritated or impatient.  Depression. This is feeling sad, down, helpless, or guilty.  Difficulty focusing, remembering, or making decisions.  Stress may cause physical symptoms, including the following:  Aches and pains. These may affect your head, neck, back, stomach, or other areas of your body.  Tight muscles or clenched jaw.  Low energy or trouble sleeping.  Stress may cause unhealthy behaviors, including the following:  Eating to feel better (overeating) or skipping meals.  Sleeping too little, too much, or both.  Working too much or putting off tasks (procrastination).  Smoking, drinking alcohol, or using drugs to feel better.  How is this diagnosed? Stress is diagnosed through an assessment by your health care provider. Your health care provider will ask questions about your symptoms and any stressful life events.Your health care provider will also ask about your medical history and may order blood tests or other tests. Certain medical conditions and medicine can cause physical  symptoms similar to stress. Mental illness can cause emotional symptoms and unhealthy behaviors similar to stress. Your health care provider may refer you to a mental health professional for further evaluation. How is this treated? Stress management is the recommended treatment for stress.The goals of stress management are reducing stressful life events and coping with stress in healthy ways. Techniques for reducing stressful life events include the following:  Stress identification. Self-monitor for stress and identify what causes stress for you. These skills may help you to avoid some stressful events.  Time management. Set your priorities, keep a calendar of events, and learn to say "no." These tools can help you avoid making too many commitments.  Techniques for coping with stress include the following:  Rethinking the problem. Try to think realistically about stressful events rather than ignoring them or overreacting. Try to find the positives in a stressful situation rather than focusing on the negatives.  Exercise. Physical exercise can release both physical and emotional tension. The key is to find a form of exercise you enjoy and do it regularly.  Relaxation techniques. These relax the body and mind. Examples include yoga, meditation, tai chi, biofeedback, deep breathing, progressive muscle relaxation, listening to music,  being out in nature, journaling, and other hobbies. Again, the key is to find one or more that you enjoy and can do regularly.  Healthy lifestyle. Eat a balanced diet, get plenty of sleep, and do not smoke. Avoid using alcohol or drugs to relax.  Strong support network. Spend time with family, friends, or other people you enjoy being around.Express your feelings and talk things over with someone you trust.  Counseling or talktherapy with a mental health professional may be helpful if you are having difficulty managing stress on your own. Medicine is typically not  recommended for the treatment of stress.Talk to your health care provider if you think you need medicine for symptoms of stress. Follow these instructions at home:  Keep all follow-up visits as directed by your health care provider.  Take all medicines as directed by your health care provider. Contact a health care provider if:  Your symptoms get worse or you start having new symptoms.  You feel overwhelmed by your problems and can no longer manage them on your own. Get help right away if:  You feel like hurting yourself or someone else. This information is not intended to replace advice given to you by your health care provider. Make sure you discuss any questions you have with your health care provider. Document Released: 08/29/2000 Document Revised: 08/11/2015 Document Reviewed: 10/28/2012 Elsevier Interactive Patient Education  2017 Elsevier Inc.     If you have lab work done today you will be contacted with your lab results within the next 2 weeks.  If you have not heard from us then please contact us. The fastest way to get your results is to register for My Chart.     IF you received an x-ray today, you will receive an invoice from [facility]. Please contact [facility] at [phone] with questions or concerns regarding your invoice.   IF you received labwork today, you will receive an invoice from [facility]. Please contact [facility] at [phone] with questions or concerns regarding your invoice.   Our billing staff will not be able to assist you with questions regarding bills from these companies.  You will be contacted with the lab results as soon as they are available. The fastest way to get your results is to activate your My Chart account. Instructions are located on the last page of this paperwork. If you have not heard from us regarding the results in 2 weeks, please contact this office.      

## 2018-02-19 LAB — COMPREHENSIVE METABOLIC PANEL
ALBUMIN: 4.3 g/dL (ref 3.5–5.5)
ALK PHOS: 56 IU/L (ref 39–117)
ALT: 26 IU/L (ref 0–44)
AST: 18 IU/L (ref 0–40)
Albumin/Globulin Ratio: 1.7 (ref 1.2–2.2)
BUN / CREAT RATIO: 13 (ref 9–20)
BUN: 11 mg/dL (ref 6–24)
Bilirubin Total: 0.5 mg/dL (ref 0.0–1.2)
CO2: 21 mmol/L (ref 20–29)
CREATININE: 0.86 mg/dL (ref 0.76–1.27)
Calcium: 9.5 mg/dL (ref 8.7–10.2)
Chloride: 100 mmol/L (ref 96–106)
GFR calc Af Amer: 120 mL/min/{1.73_m2} (ref >59)
GFR calc non Af Amer: 104 mL/min/{1.73_m2} (ref >59)
GLOBULIN, TOTAL: 2.6 g/dL (ref 1.5–4.5)
Glucose: 92 mg/dL (ref 65–99)
Potassium: 4.2 mmol/L (ref 3.5–5.2)
Sodium: 137 mmol/L (ref 134–144)
Total Protein: 6.9 g/dL (ref 6.0–8.5)

## 2018-02-19 LAB — HEMOGLOBIN A1C
ESTIMATED AVERAGE GLUCOSE: 103 mg/dL
HEMOGLOBIN A1C: 5.2 % (ref 4.8–5.6)

## 2018-02-19 LAB — CBC
Hematocrit: 46.5 % (ref 37.5–51.0)
Hemoglobin: 15.9 g/dL (ref 13.0–17.7)
MCH: 29.4 pg (ref 26.6–33.0)
MCHC: 34.2 g/dL (ref 31.5–35.7)
MCV: 86 fL (ref 79–97)
Platelets: 230 10*3/uL (ref 150–450)
RBC: 5.41 x10E6/uL (ref 4.14–5.80)
RDW: 13.3 % (ref 12.3–15.4)
WBC: 6.3 10*3/uL (ref 3.4–10.8)

## 2018-02-19 LAB — T3, FREE: T3, Free: 3.2 pg/mL (ref 2.0–4.4)

## 2018-02-19 LAB — LIPID PANEL
CHOLESTEROL TOTAL: 205 mg/dL — AB (ref 100–199)
Chol/HDL Ratio: 3.9 ratio (ref 0.0–5.0)
HDL: 53 mg/dL (ref >39)
LDL Calculated: 134 mg/dL — ABNORMAL HIGH (ref 0–99)
Triglycerides: 90 mg/dL (ref 0–149)
VLDL CHOLESTEROL CAL: 18 mg/dL (ref 5–40)

## 2018-02-19 LAB — TSH+FREE T4
FREE T4: 1.09 ng/dL (ref 0.82–1.77)
TSH: 2.32 u[IU]/mL (ref 0.450–4.500)

## 2018-02-19 LAB — TESTOSTERONE: Testosterone: 291 ng/dL (ref 264–916)

## 2018-02-20 DIAGNOSIS — L814 Other melanin hyperpigmentation: Secondary | ICD-10-CM | POA: Diagnosis not present

## 2018-02-20 DIAGNOSIS — L821 Other seborrheic keratosis: Secondary | ICD-10-CM | POA: Diagnosis not present

## 2018-02-20 DIAGNOSIS — D225 Melanocytic nevi of trunk: Secondary | ICD-10-CM | POA: Diagnosis not present

## 2018-03-21 ENCOUNTER — Ambulatory Visit: Payer: BLUE CROSS/BLUE SHIELD | Admitting: Family Medicine

## 2019-03-31 IMAGING — MR MR CERVICAL SPINE W/O CM
4 of 5 series · 29 of 48 positions shown · non-contrast
Comparison: None.

CLINICAL DATA: Left shoulder and arm pain with weakness and
numbness.

EXAM:
MRI CERVICAL SPINE WITHOUT CONTRAST
TECHNIQUE: Multiplanar, multisequence MR imaging of the cervical spine was
performed. No intravenous contrast was administered.

[Series 3: T2 · sagittal · 3.0mm · 0.66mm/px · 7 of 17 slices shown (1 of 2)]
[im 1/17]
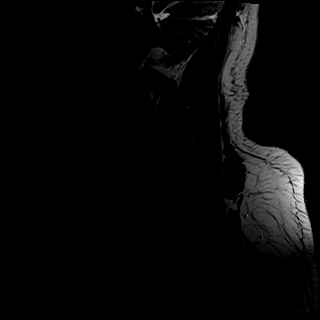
[im 3/17]
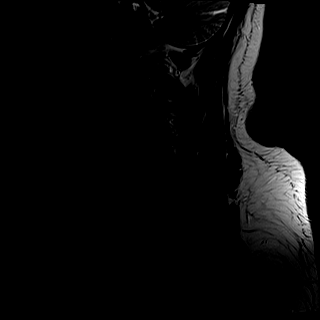
[im 6/17]
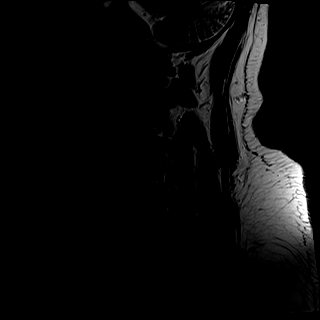
[im 9/17]
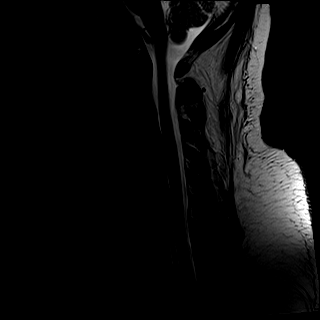
[im 11/17]
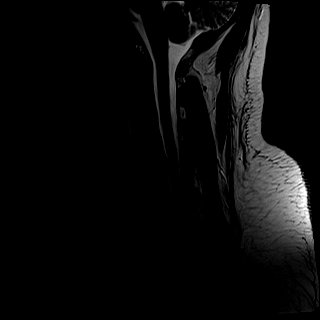
[im 14/17]
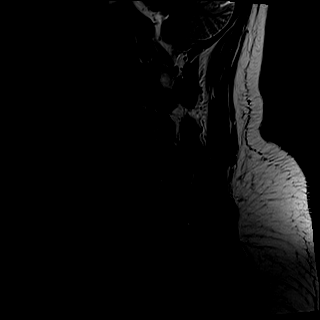
[im 17/17]
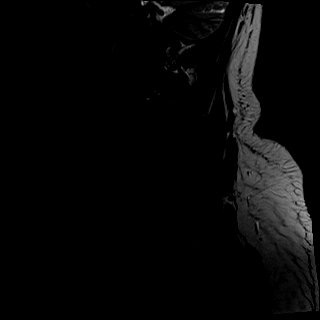

[Series 4: T1 · sagittal · 3.0mm · 0.41mm/px · 7 of 17 slices shown]
[im 1/17]
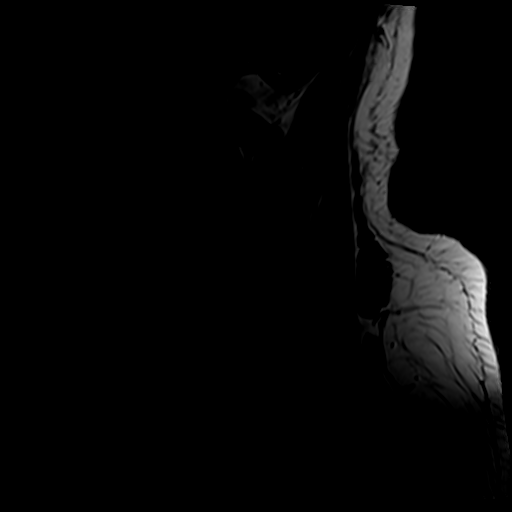
[im 3/17]
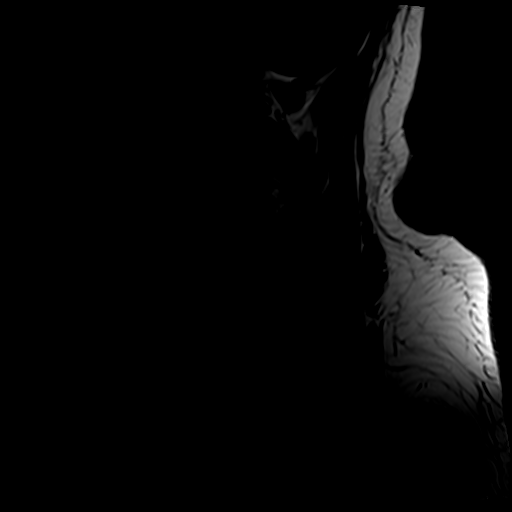
[im 6/17]
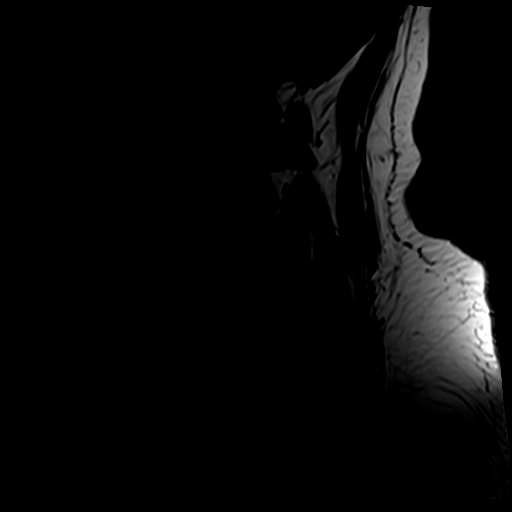
[im 9/17]
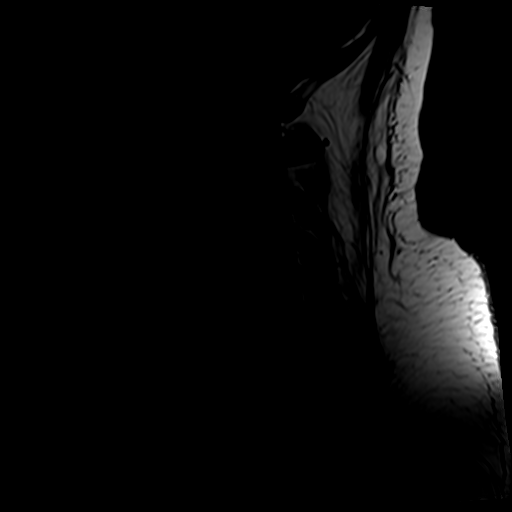
[im 11/17]
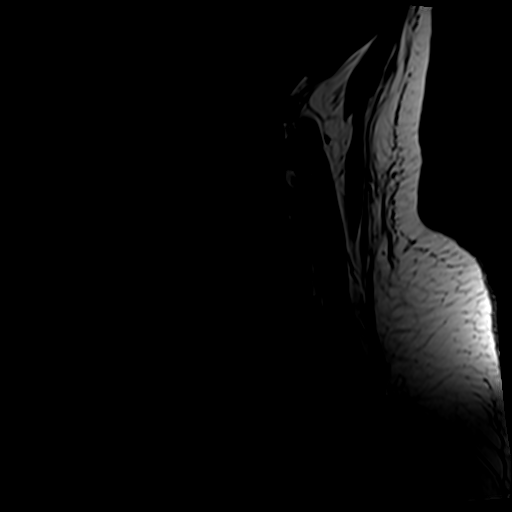
[im 14/17]
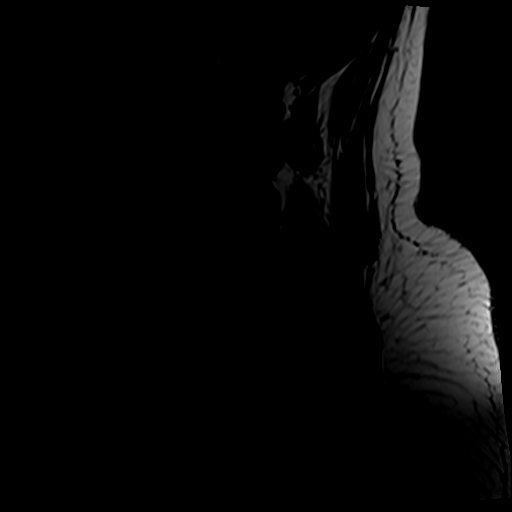
[im 17/17]
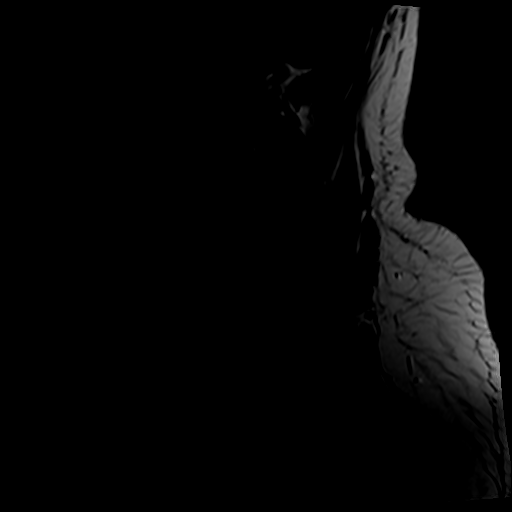

[Series 5: tir sag · sagittal · 3.0mm · 0.41mm/px · 6 of 17 slices shown]
[im 1/17]
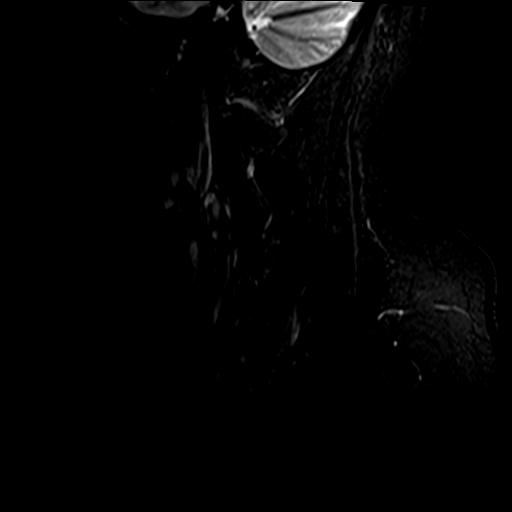
[im 3/17]
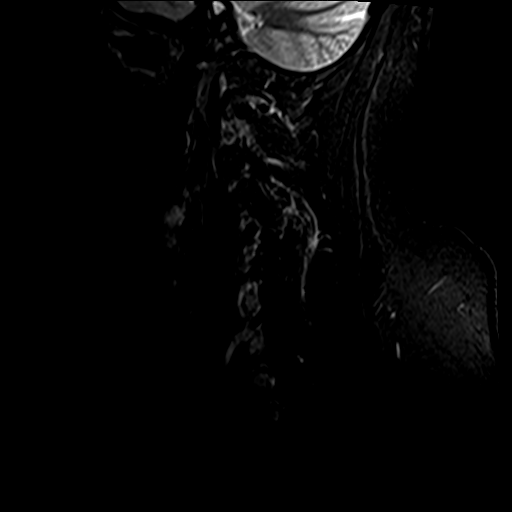
[im 5/17]
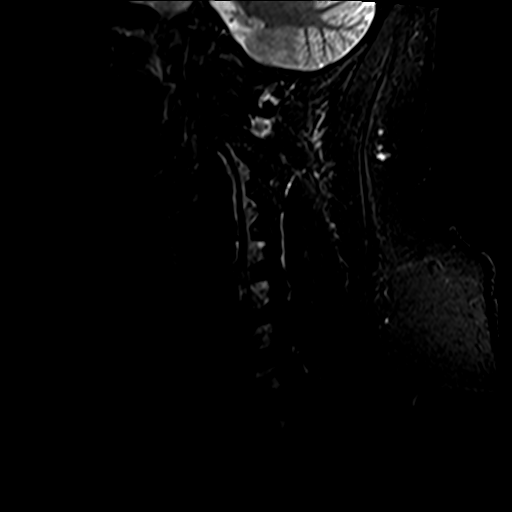
[im 7/17]
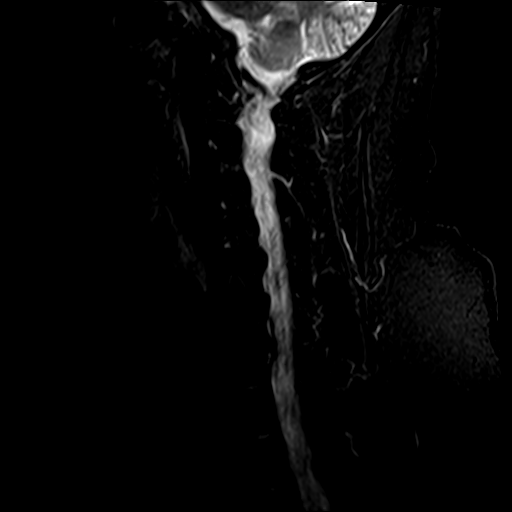
[im 10/17]
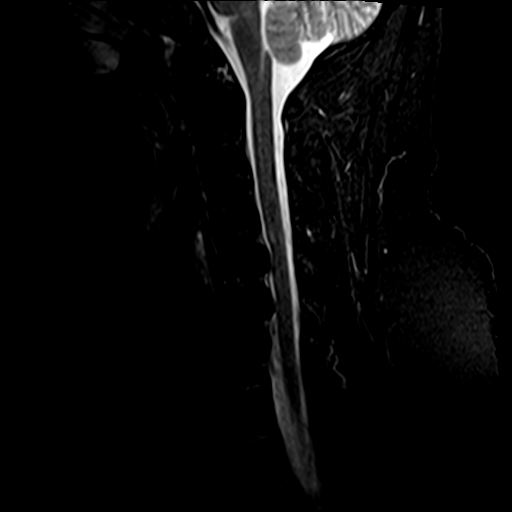
[im 14/17]
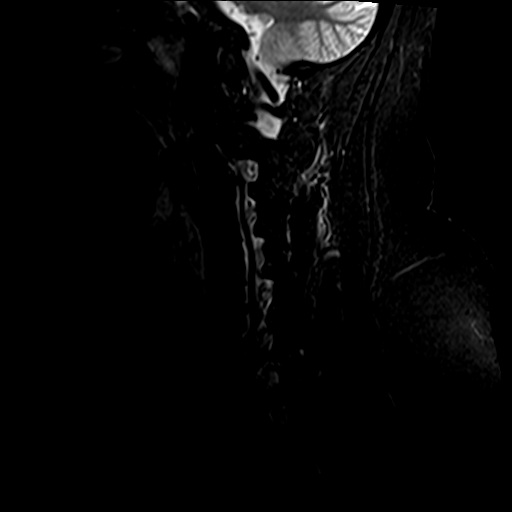

[Series 7: T2 · axial · 3.0mm · 0.70mm/px · z∈[-52,+49]mm · 9 of 28 slices shown (2 of 2)]
[im 1/28]
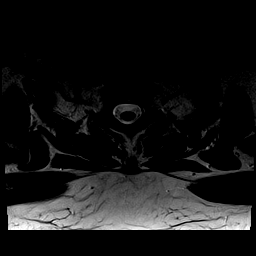
[im 5/28]
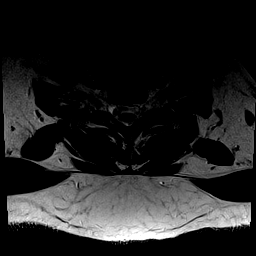
[im 10/28]
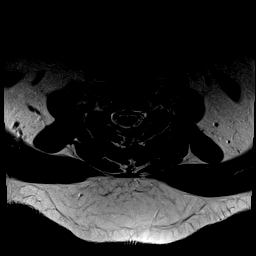
[im 12/28]
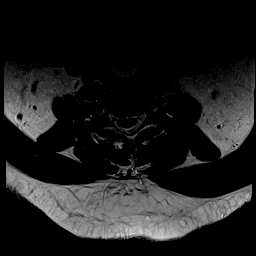
[im 14/28]
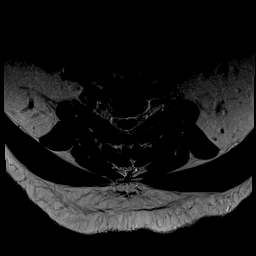
[im 16/28]
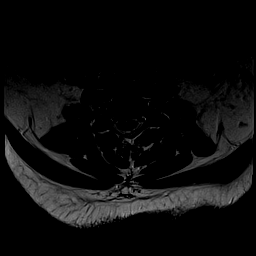
[im 19/28]
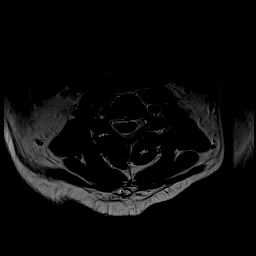
[im 23/28]
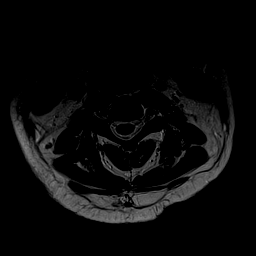
[im 28/28]
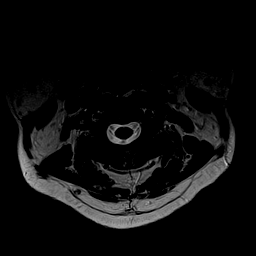

[29 of 48 positions shown; findings below may reference images not displayed]

FINDINGS: Alignment: Normal.

Vertebrae: No focal marrow lesion. No compression fracture or
evidence of discitis osteomyelitis.

Cord: Normal caliber and signal.

Posterior Fossa, vertebral arteries, paraspinal tissues: Visualized
posterior fossa is normal. Vertebral artery flow voids are
preserved. No prevertebral effusion.

Disc levels:

The C1-2 articulation is normal.

C2-C3: Normal disc and facets. No spinal canal stenosis. No
neuroforaminal stenosis.

C3-C4: Normal disc and facets. No spinal canal stenosis. No
neuroforaminal stenosis.

C4-C5: Right-greater-than-left uncovertebral hypertrophy. Normal
facets. No spinal canal stenosis. Moderate right neuroforaminal
stenosis.

C5-C6: Small right subarticular disc osteophyte complex narrows the
ventral thecal sac and flattens the anterior spinal cord. No central
spinal canal stenosis. No neuroforaminal stenosis.

C6-C7: Medium-sized left subarticular and foraminal disc protrusion.
No spinal canal stenosis. Moderate-to-severe left neuroforaminal
stenosis.

C7-T1: Normal disc and facets. No spinal canal stenosis. No
neuroforaminal stenosis.

T1-T3 is imaged in the sagittal plane only, with no disc herniation
or stenosis.
IMPRESSION: 1. Left C6-7 subarticular/foraminal disc protrusion with
moderate-to-severe left neural foraminal stenosis.
2. Moderate right C4-5 neural foraminal stenosis secondary to
right-greater-than-left uncovertebral hypertrophy.
3. Minimal mass effect on the spinal cord at C5-C6 from right
subarticular disc osteophyte complex.

## 2019-05-24 ENCOUNTER — Ambulatory Visit: Payer: BC Managed Care – PPO | Attending: Internal Medicine

## 2019-05-24 DIAGNOSIS — Z23 Encounter for immunization: Secondary | ICD-10-CM

## 2019-05-24 NOTE — Progress Notes (Signed)
   Covid-19 Vaccination Clinic  Name:  Steven Hudson    MRN: [id number] DOB: [birth year]  05/24/2019  [name] was observed post Covid-19 immunization for 15 minutes without incident. He was provided with Vaccine Information Sheet and instruction to access the V-Safe system.   [name] was instructed to call 911 with any severe reactions post vaccine: Marland Kitchen Difficulty breathing  . Swelling of face and throat  . A fast heartbeat  . A bad rash all over body  . Dizziness and weakness   Immunizations Administered    Name Date Dose VIS Date Route   Pfizer COVID-19 Vaccine 05/24/2019  6:12 PM 0.3 mL 02/27/2019 Intramuscular   Manufacturer: [employer], [employer]   Lot: [id number]   NDC: [address]-1

## 2019-05-28 ENCOUNTER — Ambulatory Visit: Payer: BLUE CROSS/BLUE SHIELD

## 2019-06-14 ENCOUNTER — Ambulatory Visit: Payer: BC Managed Care – PPO

## 2019-06-24 ENCOUNTER — Ambulatory Visit: Payer: BC Managed Care – PPO

## 2019-06-24 ENCOUNTER — Ambulatory Visit: Payer: BC Managed Care – PPO | Attending: Internal Medicine

## 2019-06-24 DIAGNOSIS — Z23 Encounter for immunization: Secondary | ICD-10-CM

## 2019-06-24 NOTE — Progress Notes (Signed)
   Covid-19 Vaccination Clinic  Name:  Steven Hudson    MRN: [id number] DOB: Jan 15, 1971  06/24/2019  Mr. [name] was observed post Covid-19 immunization for 15 minutes without incident. He was provided with Vaccine Information Sheet and instruction to access the V-Safe system.   Mr. [name] was instructed to call 911 with any severe reactions post vaccine: Marland Kitchen Difficulty breathing  . Swelling of face and throat  . A fast heartbeat  . A bad rash all over body  . Dizziness and weakness   Immunizations Administered    Name Date Dose VIS Date Route   Pfizer COVID-19 Vaccine 06/24/2019  1:40 PM 0.3 mL 02/27/2019 Intramuscular   Manufacturer: [employer], [employer]   Lot: [id number]   NDC: [address]-0

## 2019-09-04 ENCOUNTER — Telehealth: Payer: Self-pay | Admitting: Family Medicine

## 2019-09-04 DIAGNOSIS — Z131 Encounter for screening for diabetes mellitus: Secondary | ICD-10-CM

## 2019-09-04 DIAGNOSIS — L659 Nonscarring hair loss, unspecified: Secondary | ICD-10-CM

## 2019-09-04 DIAGNOSIS — Z130 Encounter for screening for diseases of the blood and blood-forming organs and certain disorders involving the immune mechanism: Secondary | ICD-10-CM

## 2019-09-04 DIAGNOSIS — Z8349 Family history of other endocrine, nutritional and metabolic diseases: Secondary | ICD-10-CM

## 2019-09-04 DIAGNOSIS — Z0000 Encounter for general adult medical examination without abnormal findings: Secondary | ICD-10-CM

## 2019-09-04 DIAGNOSIS — E785 Hyperlipidemia, unspecified: Secondary | ICD-10-CM

## 2019-09-04 DIAGNOSIS — R454 Irritability and anger: Secondary | ICD-10-CM

## 2019-09-04 NOTE — Telephone Encounter (Signed)
Patient requesting orders for his labs to drawn on Monday 09/07/2019 for physical

## 2019-09-07 ENCOUNTER — Other Ambulatory Visit: Payer: Self-pay

## 2019-09-07 ENCOUNTER — Ambulatory Visit (INDEPENDENT_AMBULATORY_CARE_PROVIDER_SITE_OTHER): Payer: BC Managed Care – PPO | Admitting: Family Medicine

## 2019-09-07 DIAGNOSIS — Z8349 Family history of other endocrine, nutritional and metabolic diseases: Secondary | ICD-10-CM

## 2019-09-07 DIAGNOSIS — R454 Irritability and anger: Secondary | ICD-10-CM | POA: Diagnosis not present

## 2019-09-07 DIAGNOSIS — Z131 Encounter for screening for diabetes mellitus: Secondary | ICD-10-CM | POA: Diagnosis not present

## 2019-09-07 DIAGNOSIS — E785 Hyperlipidemia, unspecified: Secondary | ICD-10-CM

## 2019-09-07 DIAGNOSIS — Z130 Encounter for screening for diseases of the blood and blood-forming organs and certain disorders involving the immune mechanism: Secondary | ICD-10-CM

## 2019-09-07 DIAGNOSIS — L659 Nonscarring hair loss, unspecified: Secondary | ICD-10-CM

## 2019-09-07 DIAGNOSIS — Z0000 Encounter for general adult medical examination without abnormal findings: Secondary | ICD-10-CM

## 2019-09-08 LAB — CMP14+EGFR
ALT: 18 IU/L (ref 0–44)
AST: 20 IU/L (ref 0–40)
Albumin/Globulin Ratio: 1.9 (ref 1.2–2.2)
Albumin: 4.3 g/dL (ref 4.0–5.0)
Alkaline Phosphatase: 69 IU/L (ref 48–121)
BUN/Creatinine Ratio: 23 — ABNORMAL HIGH (ref 9–20)
BUN: 22 mg/dL (ref 6–24)
Bilirubin Total: 0.6 mg/dL (ref 0.0–1.2)
CO2: 22 mmol/L (ref 20–29)
Calcium: 9.3 mg/dL (ref 8.7–10.2)
Chloride: 101 mmol/L (ref 96–106)
Creatinine, Ser: 0.94 mg/dL (ref 0.76–1.27)
GFR calc Af Amer: 111 mL/min/{1.73_m2} (ref >59)
GFR calc non Af Amer: 96 mL/min/{1.73_m2} (ref >59)
Globulin, Total: 2.3 g/dL (ref 1.5–4.5)
Glucose: 81 mg/dL (ref 65–99)
Potassium: 4.5 mmol/L (ref 3.5–5.2)
Sodium: 140 mmol/L (ref 134–144)
Total Protein: 6.6 g/dL (ref 6.0–8.5)

## 2019-09-08 LAB — LIPID PANEL
Chol/HDL Ratio: 3.5 ratio (ref 0.0–5.0)
Cholesterol, Total: 194 mg/dL (ref 100–199)
HDL: 55 mg/dL (ref >39)
LDL Chol Calc (NIH): 126 mg/dL — ABNORMAL HIGH (ref 0–99)
Triglycerides: 71 mg/dL (ref 0–149)
VLDL Cholesterol Cal: 13 mg/dL (ref 5–40)

## 2019-09-08 LAB — T3, FREE: T3, Free: 2.6 pg/mL (ref 2.0–4.4)

## 2019-09-08 LAB — TSH: TSH: 2.16 u[IU]/mL (ref 0.450–4.500)

## 2019-09-08 LAB — CBC
Hematocrit: 47 % (ref 37.5–51.0)
Hemoglobin: 15.7 g/dL (ref 13.0–17.7)
MCH: 28.8 pg (ref 26.6–33.0)
MCHC: 33.4 g/dL (ref 31.5–35.7)
MCV: 86 fL (ref 79–97)
Platelets: 214 10*3/uL (ref 150–450)
RBC: 5.45 x10E6/uL (ref 4.14–5.80)
RDW: 12.5 % (ref 11.6–15.4)
WBC: 7 10*3/uL (ref 3.4–10.8)

## 2019-09-08 LAB — HEMOGLOBIN A1C
Est. average glucose Bld gHb Est-mCnc: 97 mg/dL
Hgb A1c MFr Bld: 5 % (ref 4.8–5.6)

## 2019-09-08 LAB — T4, FREE: Free T4: 1.19 ng/dL (ref 0.82–1.77)

## 2019-09-08 LAB — TESTOSTERONE: Testosterone: 326 ng/dL (ref 264–916)

## 2019-09-10 ENCOUNTER — Encounter: Payer: BLUE CROSS/BLUE SHIELD | Admitting: Family Medicine

## 2019-09-10 ENCOUNTER — Ambulatory Visit (INDEPENDENT_AMBULATORY_CARE_PROVIDER_SITE_OTHER): Payer: BC Managed Care – PPO | Admitting: Family Medicine

## 2019-09-10 ENCOUNTER — Encounter: Payer: Self-pay | Admitting: Family Medicine

## 2019-09-10 ENCOUNTER — Other Ambulatory Visit: Payer: Self-pay

## 2019-09-10 VITALS — BP 137/85 | HR 60 | Temp 98.0°F | Resp 16 | Ht 72.0 in | Wt 245.0 lb

## 2019-09-10 DIAGNOSIS — Z1211 Encounter for screening for malignant neoplasm of colon: Secondary | ICD-10-CM

## 2019-09-10 DIAGNOSIS — Z8349 Family history of other endocrine, nutritional and metabolic diseases: Secondary | ICD-10-CM

## 2019-09-10 DIAGNOSIS — L659 Nonscarring hair loss, unspecified: Secondary | ICD-10-CM

## 2019-09-10 DIAGNOSIS — Z0001 Encounter for general adult medical examination with abnormal findings: Secondary | ICD-10-CM | POA: Diagnosis not present

## 2019-09-10 DIAGNOSIS — R454 Irritability and anger: Secondary | ICD-10-CM

## 2019-09-10 DIAGNOSIS — Z130 Encounter for screening for diseases of the blood and blood-forming organs and certain disorders involving the immune mechanism: Secondary | ICD-10-CM

## 2019-09-10 DIAGNOSIS — Z0000 Encounter for general adult medical examination without abnormal findings: Secondary | ICD-10-CM

## 2019-09-10 DIAGNOSIS — Z131 Encounter for screening for diabetes mellitus: Secondary | ICD-10-CM

## 2019-09-10 DIAGNOSIS — E785 Hyperlipidemia, unspecified: Secondary | ICD-10-CM

## 2019-09-10 NOTE — Patient Instructions (Addendum)
  Arrange for a screening colonoscopy.  I have entered an order for that, and someone will probably contact you about it.  It should be done after you finish your outing and before you forget all about it.  Continue working hard on the weight loss and exercise.  Congratulations on how well you have done.  I am not concerned about any of your labs.  The change in the BUN is probably from your dietary changes.  Wait and see what your lipids do once you are at a plateau.  Continue to seek to be physically, emotionally, relationally, and spiritually healthy.  Return as needed   If you have lab work done today you will be contacted with your lab results within the next 2 weeks.  If you have not heard from [place] then please contact us. The fastest way to get your results is to register for My Chart.   IF you received an x-ray today, you will receive an invoice from [facility]. Please contact [facility] at [phone] with questions or concerns regarding your invoice.   IF you received labwork today, you will receive an invoice from [place]. Please contact [facility] at [phone] with questions or concerns regarding your invoice.   Our billing staff will not be able to assist you with questions regarding bills from these companies.  You will be contacted with the lab results as soon as they are available. The fastest way to get your results is to activate your My Chart account. Instructions are located on the last page of this paperwork. If you have not heard from [place] regarding the results in 2 weeks, please contact this office.

## 2019-09-10 NOTE — Progress Notes (Signed)
Patient ID: Steven Hudson, male    DOB: [birth year]  Age: 49 y.o. MRN: [id number]  Chief Complaint  Patient presents with  . Annual Exam    pt would like to review lab work and discuss diet     Subjective:   49 year old man here for a physical exam before going on a scouting trip to [place] with his son.  It will be a week of hiking and canoeing and [place].  He is worked hard on a diet of high-protein, avoiding carbs, and eating plenty of vegetables and fruit.  This is notable to him to lose a great deal of weight, and he would like to still lose about 40 pounds more.  No physical symptoms.  He had some questions about his lab numbers and we discussed them.  Past medical history: Medications: None Allergies: None Illnesses: None Surgeries: Eye surgery in 1997 due to trauma, no residual problems  Family history Mother died of a quick-acting leukemia which killed her in a few days before much therapy could be begun.  She also had hypertension.  Father died from diabetes Brother has hypertension.  Social history: No smoking.  Occasional alcohol.  Married.  Works in [employer] for [employer]  Review of systems Constitutional: Unremarkable HEENT: Unremarkable Respiratory: Unremarkable Cardiovascular: Unremarkable Gastrointestinal: Unremarkable Endocrine: Unremarkable Genitourinary: Unremarkable Musculoskeletal: Unremarkable.  He does have a history of a bulging cervical disc but does not give him any troubles now. Dermatologic: Unremarkable Allergy: Unremarkable Neurologic: Unremarkable Hematologic: Unremarkable Psychiatric: Unremarkable   Current allergies, medications, problem list, past/family and social histories reviewed.  Objective:  BP 137/85   Pulse 60   Temp 98 F (36.7 C) (Temporal)   Resp 16   Ht 6' (1.829 m)   Wt 245 lb (111.1 kg)   SpO2 98%   BMI 33.23 kg/m   Mildly overweight, no acute distress, alert and oriented.  TMs normal.  Eyes PERRL.   Throat clear.  Neck supple without nodes or thyromegaly.  No carotid bruits.  Chest is clear to auscultation.  Heart rate without murmurs.  Abdomen soft without mass or tenderness.  Normal male external genitalia with testes descended.  No hernias.  He has prominent vessels in the upper right scrotum, possibly a little varicocele, no concern.  Extremities unremarkable.  Skin unremarkable.  He does have a lot of moles.  Everything looks benign.  Assessment & Plan:   Assessment: 1. Annual physical exam   2. Hair thinning   3. Family history of thyroid disease   4. Screening, anemia, deficiency, iron   5. Irritability   6. Hyperlipidemia, unspecified hyperlipidemia type   7. Screening for diabetes mellitus   8. Special screening for malignant neoplasms, colon       Plan: See instructions. Orders Placed This Encounter  Procedures  . [facility] COLONOSCOPY    No orders of the defined types were placed in this encounter.        Patient Instructions    Arrange for a screening colonoscopy.  I have entered an order for that, and someone will probably contact you about it.  It should be done after you finish your outing and before you forget all about it.  Continue working hard on the weight loss and exercise.  Congratulations on how well you have done.  I am not concerned about any of your labs.  The change in the BUN is probably from your dietary changes.  Wait and see what your lipids do once  you are at a plateau.  Continue to seek to be physically, emotionally, relationally, and spiritually healthy.  Return as needed   If you have lab work done today you will be contacted with your lab results within the next 2 weeks.  If you have not heard from [place] then please contact us. The fastest way to get your results is to register for My Chart.   IF you received an x-ray today, you will receive an invoice from [facility]. Please contact [facility] at [phone] with  questions or concerns regarding your invoice.   IF you received labwork today, you will receive an invoice from [place]. Please contact [facility] at [phone] with questions or concerns regarding your invoice.   Our billing staff will not be able to assist you with questions regarding bills from these companies.  You will be contacted with the lab results as soon as they are available. The fastest way to get your results is to activate your My Chart account. Instructions are located on the last page of this paperwork. If you have not heard from [place] regarding the results in 2 weeks, please contact this office.        Return in about 1 year (around 09/10/2046).   Janace Hoard, MD 09/10/2019

## 2020-01-08 ENCOUNTER — Other Ambulatory Visit: Payer: Self-pay

## 2020-01-08 ENCOUNTER — Emergency Department (HOSPITAL_BASED_OUTPATIENT_CLINIC_OR_DEPARTMENT_OTHER): Payer: BC Managed Care – PPO

## 2020-01-08 ENCOUNTER — Emergency Department (HOSPITAL_BASED_OUTPATIENT_CLINIC_OR_DEPARTMENT_OTHER)
Admission: EM | Admit: 2020-01-08 | Discharge: 2020-01-08 | Disposition: A | Discharge status: Home / Self Care | Payer: BC Managed Care – PPO | Attending: Emergency Medicine | Admitting: Emergency Medicine

## 2020-01-08 DIAGNOSIS — I82451 Acute embolism and thrombosis of right peroneal vein: Secondary | ICD-10-CM | POA: Insufficient documentation

## 2020-01-08 DIAGNOSIS — M79661 Pain in right lower leg: Secondary | ICD-10-CM | POA: Diagnosis not present

## 2020-01-08 DIAGNOSIS — I82491 Acute embolism and thrombosis of other specified deep vein of right lower extremity: Secondary | ICD-10-CM | POA: Diagnosis not present

## 2020-01-08 DIAGNOSIS — I824Z1 Acute embolism and thrombosis of unspecified deep veins of right distal lower extremity: Secondary | ICD-10-CM | POA: Diagnosis not present

## 2020-01-08 DIAGNOSIS — M79606 Pain in leg, unspecified: Secondary | ICD-10-CM

## 2020-01-08 DIAGNOSIS — M79604 Pain in right leg: Secondary | ICD-10-CM | POA: Diagnosis not present

## 2020-01-08 LAB — CBC
HCT: 45.8 % (ref 39.0–52.0)
Hemoglobin: 15.7 g/dL (ref 13.0–17.0)
MCH: 29.1 pg (ref 26.0–34.0)
MCHC: 34.3 g/dL (ref 30.0–36.0)
MCV: 84.8 fL (ref 80.0–100.0)
Platelets: 183 10*3/uL (ref 150–400)
RBC: 5.4 MIL/uL (ref 4.22–5.81)
RDW: 12.9 % (ref 11.5–15.5)
WBC: 8.9 10*3/uL (ref 4.0–10.5)
nRBC: 0 % (ref 0.0–0.2)

## 2020-01-08 LAB — BASIC METABOLIC PANEL
Anion gap: 10 (ref 5–15)
BUN: 18 mg/dL (ref 6–20)
CO2: 25 mmol/L (ref 22–32)
Calcium: 9.1 mg/dL (ref 8.9–10.3)
Chloride: 102 mmol/L (ref 98–111)
Creatinine, Ser: 0.81 mg/dL (ref 0.61–1.24)
GFR, Estimated: >60 mL/min (ref >60)
Glucose, Bld: 89 mg/dL (ref 70–99)
Potassium: 3.7 mmol/L (ref 3.5–5.1)
Sodium: 137 mmol/L (ref 135–145)

## 2020-01-08 MED ORDER — APIXABAN (ELIQUIS) VTE STARTER PACK (10MG AND 5MG): ORAL_TABLET | ORAL | 0 refills | Status: DC

## 2020-01-08 MED ORDER — APIXABAN (ELIQUIS) EDUCATION KIT FOR DVT/PE PATIENTS: PACK | Freq: Once | Status: AC

## 2020-01-08 MED ORDER — APIXABAN 2.5 MG PO TABS: 10.0000 mg | ORAL_TABLET | Freq: Once | ORAL | Status: AC

## 2020-01-08 MED ORDER — APIXABAN 2.5 MG PO TABS
ORAL_TABLET | ORAL | Status: AC
  Administered 2020-01-08: 10 mg via ORAL
  Filled 2020-01-08: qty 4

## 2020-01-08 NOTE — ED Provider Notes (Signed)
[facility] EMERGENCY DEPARTMENT Provider Note   CSN: [id number] Arrival date & time: 01/08/20  1632     History Chief Complaint  Patient presents with  . Leg Pain    Steven Hudson is a 49 y.o. male with no pertinent past medical history who presents today for evaluation of right leg pain since this morning.  He woke up with pain in his right leg behind the knee and down the leg.  It has not changed.  He denies any prior history of DVT/PE.  No recent surgeries, immobilizations, prolonged travel.  He does not smoke.  He does not use any hormones.  He denies any family history of DVT/PE.  No chest pain, cough, or shortness of breath.  No fatigue, lightheadedness or feeling dizzy.  HPI     No past medical history on file.  Patient Active Problem List   Diagnosis Date Noted  . Obesity, unspecified 05/22/2012  . Adult ADHD 05/22/2012    Past Surgical History:  Procedure Laterality Date  . EYE SURGERY         Family History  Problem Relation Age of Onset  . Hypertension Mother   . Leukemia Mother   . Cancer Mother   . Diabetes Father   . Hypertension Brother   . Heart disease Maternal Grandmother   . Heart disease Maternal Grandfather   . Diabetes Paternal Grandfather     Social History   Tobacco Use  . Smoking status: Never Smoker  . Smokeless tobacco: Former User    Types: Chew  Substance Use Topics  . Alcohol use: Yes  . Drug use: No    Home Medications Prior to Admission medications   Medication Sig Start Date End Date Taking? Authorizing Provider  APIXABAN (ELIQUIS) VTE STARTER PACK (10MG AND 5MG) Take as directed on package: start with two-5mg tablets twice daily for 7 days. On day 8, switch to one-5mg tablet twice daily. 01/08/20   Hammond, Elizabeth W, PA-C    Allergies    Patient has no known allergies.  Review of Systems   Review of Systems  Constitutional: Negative for chills and fever.  Respiratory: Negative for cough, chest  tightness and shortness of breath.   Cardiovascular: Negative for chest pain, palpitations and leg swelling.  Gastrointestinal: Negative for abdominal pain.  Genitourinary: Negative for dysuria.  Musculoskeletal: Negative for back pain.       Pain in right leg  All other systems reviewed and are negative.   Physical Exam Updated Vital Signs BP (!) 136/103 (BP Location: Right Arm)   Pulse 62   Temp 98.3 F (36.8 C) (Oral)   Resp 14   Ht 6' (1.829 m)   Wt 114.8 kg   SpO2 99%   BMI 34.31 kg/m   Physical Exam Vitals and nursing note reviewed.  Constitutional:      General: He is not in acute distress.    Appearance: He is well-developed. He is not diaphoretic.  HENT:     Head: Normocephalic and atraumatic.  Eyes:     General: No scleral icterus.       Right eye: No discharge.        Left eye: No discharge.     Conjunctiva/sclera: Conjunctivae normal.  Cardiovascular:     Rate and Rhythm: Normal rate and regular rhythm.     Pulses: Normal pulses.     Heart sounds: Normal heart sounds.     Comments: 2+ DP/PT pulses bilaterally. Pulmonary:       Effort: Pulmonary effort is normal. No respiratory distress.     Breath sounds: No stridor.  Abdominal:     General: There is no distension.  Musculoskeletal:        General: No deformity.     Cervical back: Normal range of motion.     Comments: No obvious edema to bilateral lower extremities.  Mild tenderness to palpation over the right proximal calf.    Skin:    General: Skin is warm and dry.  Neurological:     Mental Status: He is alert.     Motor: No abnormal muscle tone.  Psychiatric:        Behavior: Behavior normal.     ED Results / Procedures / Treatments   Labs (all labs ordered are listed, but only abnormal results are displayed) Labs Reviewed  BASIC METABOLIC PANEL  CBC    EKG None  Radiology US Venous Img Lower Unilateral Right (DVT)  Result Date: 01/08/2020 CLINICAL DATA:  Initial evaluation for  acute right calf pain. EXAM: RIGHT LOWER EXTREMITY VENOUS DOPPLER ULTRASOUND TECHNIQUE: Gray-scale sonography with graded compression, as well as color Doppler and duplex ultrasound were performed to evaluate the lower extremity deep venous systems from the level of the common femoral vein and including the common femoral, femoral, profunda femoral, popliteal and calf veins including the posterior tibial, peroneal and gastrocnemius veins when visible. The superficial great saphenous vein was also interrogated. Spectral Doppler was utilized to evaluate flow at rest and with distal augmentation maneuvers in the common femoral, femoral and popliteal veins. COMPARISON:  None. FINDINGS: Contralateral Common Femoral Vein: Respiratory phasicity is normal and symmetric with the symptomatic side. No evidence of thrombus. Normal compressibility. Common Femoral Vein: No evidence of thrombus. Normal compressibility, respiratory phasicity and response to augmentation. Saphenofemoral Junction: No evidence of thrombus. Normal compressibility and flow on color Doppler imaging. Profunda Femoral Vein: No evidence of thrombus. Normal compressibility and flow on color Doppler imaging. Femoral Vein: No evidence of thrombus. Normal compressibility, respiratory phasicity and response to augmentation. Popliteal Vein: No evidence of thrombus. Normal compressibility, respiratory phasicity and response to augmentation. Calf Veins: Echogenic material suggestive of thrombus seen within the right peroneal vein at the right calf, consistent with acute DVT. Loss of normal compressibility. This appears occlusive. The right posterior tibial vein appears patent. Superficial Great Saphenous Vein: No evidence of thrombus. Normal compressibility. Venous Reflux:  None. Other Findings:  None. IMPRESSION: Positive study with acute occlusive deep venous thrombosis involving the peroneal vein at the right calf. Electronically Signed   By: Benjamin   McClintock M.D.   On: 01/08/2020 18:04    Procedures Procedures (including critical care time)  Medications Ordered in ED Medications  apixaban (ELIQUIS) Education Kit for DVT/PE patients ( Does not apply Given 01/08/20 2006)  apixaban (ELIQUIS) tablet 10 mg (10 mg Oral Given 01/08/20 2006)    ED Course  I have reviewed the triage vital signs and the nursing notes.  Pertinent labs & imaging results that were available during my care of the patient were reviewed by me and considered in my medical decision making (see chart for details).  Clinical Course as of Jan 08 2011  Fri Jan 08, 2020  1834 US Venous Img Lower Unilateral Right (DVT) [AO]  1835 US Venous Img Lower Unilateral Right (DVT) [AO]    Clinical Course User Index [AO] Olson, Anna M, Student-PA   MDM Rules/Calculators/A&P                           Patient is a 49 year old man who presents today for evaluation of pain in his right calf since this morning.  DVT study was obtained showing a acute occlusive DVT in the peroneal vein of the right calf.  On exam he is neurovascularly intact in bilateral lower extremities.  No significant edema.  He denies any recent surgeries or immobilizations.  No history of recent procedures, intracranial hemorrhage, GI bleeding or other significant complications for anticoagulation.  Baseline labs are obtained, CBC and BMP are unremarkable.  He does not have any chest pain or shortness of breath.  He is afebrile, not tachycardic or tachypneic and at 99% on room air, no evidence of PE at this time.  Unclear cause for his DVT currently.  He is given a dose of Eliquis while in the emergency room.  Eliquis education kit ordered.    We discussed anticoagulation precautions including trauma precautions, along with general treatment and precautions for DVT.  Is given a work note as he is supposed to be flying and I would not recommend he do this until cleared to do so by a physician.  Return precautions  were discussed with patient who states their understanding.  At the time of discharge patient denied any unaddressed complaints or concerns.  Patient is agreeable for discharge home.  Note: Portions of this report may have been transcribed using voice recognition software. Every effort was made to ensure accuracy; however, inadvertent computerized transcription errors may be present   Final Clinical Impression(s) / ED Diagnoses Final diagnoses:  Acute deep vein thrombosis (DVT) of right peroneal vein ([place])    Rx / DC Orders ED Discharge Orders         Ordered    APIXABAN (ELIQUIS) VTE STARTER PACK (10MG AND 5MG)        01/08/20 2008           Lorin Glass, [place] 01/08/20 2014    Tegeler, Gwenyth Allegra, MD 01/08/20 2320

## 2020-01-08 NOTE — Discharge Instructions (Addendum)
Today your ultrasound showed a DVT or deep vein thrombosis/blood clot in the right leg.  You were given your first dose of blood thinner.  As we discussed if you sustain any trauma or strike your head you need to seek medical care and evaluation.  Additionally if you develop any chest pain, shortness of breath, cough, nausea, vomiting, dark tarry sticky stools or blood in your stools or have any other concerns you need to seek additional medical care and evaluation as these can be signs of internal bleeding ordered a blood clot that moved into your lungs.  While in the ER your blood pressure was slightly elevated, please follow up with your primary care doctor.

## 2020-01-08 NOTE — ED Triage Notes (Signed)
Woke up with left leg pain behind knee and down leg , hasn't gotten better or worse

## 2020-01-08 NOTE — ED Notes (Signed)
ED Provider at bedside. 

## 2020-01-15 ENCOUNTER — Ambulatory Visit (INDEPENDENT_AMBULATORY_CARE_PROVIDER_SITE_OTHER): Payer: BC Managed Care – PPO | Admitting: Family Medicine

## 2020-01-15 ENCOUNTER — Encounter: Payer: Self-pay | Admitting: Family Medicine

## 2020-01-15 ENCOUNTER — Other Ambulatory Visit: Payer: Self-pay

## 2020-01-15 VITALS — BP 139/89 | HR 78 | Temp 98.7°F | Ht 72.0 in | Wt 262.0 lb

## 2020-01-15 DIAGNOSIS — I82451 Acute embolism and thrombosis of right peroneal vein: Secondary | ICD-10-CM

## 2020-01-15 MED ORDER — APIXABAN 5 MG PO TABS: 5.0000 mg | ORAL_TABLET | Freq: Two times a day (BID) | ORAL | 2 refills | Status: DC

## 2020-01-15 NOTE — Progress Notes (Signed)
Subjective:  Patient ID: Steven Hudson, male    DOB: [birth year]  Age: 49 y.o. MRN: [id number]  CC:  Chief Complaint  Patient presents with  . Hospitalization Follow-up    Pt went to the ER on 01/08/2020. PT states Steven Hudson went to the ER, because Steven Hudson had a strange sinsation ion his R leg that Steven Hudson hasn't felt before. pt went to the ER and found it was a blood clot.  Pt was givin blood thiners and sent home.    HPI Steven Hudson presents for   Emergency room follow-up, DVT of right peroneal vein Evaluated at [facility] October 22.  Notes reviewed, right leg pain that morning behind the knee and down the right leg.  No recent surgery, mobilization or prolonged travel, history of obesity.  Does not smoke.  No hormones.  No shortness of breath chest pain or cough.  Ultrasound obtained with acute occlusive deep vein thrombosis involving the peroneal vein of right calf.  Unremarkable CBC, BMP.  Normal O2 sat, no evidence of PE.  Started on Eliquis.  Advised against flying until cleared.  Unknown specific cause of DVT  No prior dvt.  No known FH of blood clotting disorders.  Works from home, up from desk at times.  [place] flight  - hour? Drove to beach early October. 4 hour drive, straight through, no pain or swelling until symptoms 10/22.  Tolerating Eliquis - no new bleeding/bruising. no dark stool/blood in stool. No hematuria. No CP/dyspnea.   [place] [facility] [place] 11/8 - can drive or fly.  Frequent hiking with 80# weight loss, then decreased activity since August.  5-6 tick bites this summer - no typical lyme rash?  itching, no residual symptoms.  Phone pics noted  - erythema patch surrounding central area, no target.   History Patient Active Problem List   Diagnosis Date Noted  . Obesity, unspecified 05/22/2012  . Adult ADHD 05/22/2012   No past medical history on file. Past Surgical History:  Procedure Laterality Date  .  EYE SURGERY     No Known Allergies Prior to Admission medications   Medication Sig Start Date End Date Taking? Authorizing Provider  APIXABAN Everlene Balls(ELIQUIS) VTE STARTER PACK (10MG  AND 5MG ) Take as directed on package: start with two-5mg  tablets twice daily for 7 days. On day 8, switch to one-5mg  tablet twice daily. 01/08/20   Cristina GongHammond, Elizabeth W, PA-C   Social History   Socioeconomic History  . Marital status: Married    Spouse name: Not on file  . Number of children: Not on file  . Years of education: Not on file  . Highest education level: Not on file  Occupational History  . Not on file  Tobacco Use  . Smoking status: Never Smoker  . Smokeless tobacco: Former NeurosurgeonUser    Types: Chew  Substance and Sexual Activity  . Alcohol use: Yes  . Drug use: No  . Sexual activity: Yes  Other Topics Concern  . Not on file  Social History Narrative  . Not on file   Social Determinants of Health   Financial Resource Strain:   . Difficulty of Paying Living Expenses: Not on file  Food Insecurity:   . Worried About Programme researcher, broadcasting/film/videounning Out of Food in the Last Year: Not on file  . Ran Out of Food in the Last Year: Not on file  Transportation Needs:   . Lack of  Transportation (Medical): Not on file  . Lack of Transportation (Non-Medical): Not on file  Physical Activity:   . Days of Exercise per Week: Not on file  . Minutes of Exercise per Session: Not on file  Stress:   . Feeling of Stress : Not on file  Social Connections:   . Frequency of Communication with Friends and Family: Not on file  . Frequency of Social Gatherings with Friends and Family: Not on file  . Attends Religious Services: Not on file  . Active Member of Clubs or Organizations: Not on file  . Attends Banker Meetings: Not on file  . Marital Status: Not on file  Intimate Partner Violence:   . Fear of Current or Ex-Partner: Not on file  . Emotionally Abused: Not on file  . Physically Abused: Not on file  . Sexually  Abused: Not on file    Review of Systems   Objective:   Vitals:   01/15/20 1103  BP: 139/89  Pulse: 78  Temp: 98.7 F (37.1 C)  TempSrc: Temporal  SpO2: 99%  Weight: 262 lb (118.8 kg)  Height: 6' (1.829 m)     Physical Exam Vitals reviewed.  Constitutional:      General: Steven Hudson is not in acute distress.    Appearance: Steven Hudson is well-developed.  HENT:     Head: Normocephalic and atraumatic.  Cardiovascular:     Rate and Rhythm: Normal rate and regular rhythm.  Pulmonary:     Effort: Pulmonary effort is normal. No respiratory distress.     Breath sounds: No wheezing.  Musculoskeletal:     Comments: Calves nontender, no edema appreciated or cords.  Equal calf circumference measured at 15 cm below patella.  Negative Homans.  Skin:    General: Skin is warm and dry.     Findings: No erythema or rash.  Neurological:     General: No focal deficit present.     Mental Status: Steven Hudson is alert and oriented to person, place, and time.     35 minutes spent during visit, greater than 50% counseling and assimilation of information, chart review, and discussion of plan.   Assessment & Plan:  Steven Hudson is a 49 y.o. male . Acute deep vein thrombosis (DVT) of right peroneal vein ([facility]) - Plan: Ambulatory referral to Hematology, apixaban (ELIQUIS) 5 MG TABS tablet  -New onset peroneal DVT without specific known cause. elevated BMI, possiblel decreased activity after prior weight loss with hiking/exercise.   - Hypercoagulable work-up may be needed, refer to hematology to decide on specific testing.  Tolerating Eliquis/anticoagulation, continue starter pack, then continue dose prescription given.  Questions answered regarding activities, precautions at this point.  Handout given.  RTC precautions given.  Meds ordered this encounter  Medications  . apixaban (ELIQUIS) 5 MG TABS tablet    Sig: Take 1 tablet (5 mg total) by mouth 2 (two) times daily.    Dispense:  60 tablet    Refill:  2    Patient Instructions     I will refer you to hematology. Continue Eliquis at 5mg  twice per day after completion of starter pack.   Return to the clinic or go to the nearest emergency room if any of your symptoms worsen or new symptoms occur.   Deep Vein Thrombosis  Deep vein thrombosis (DVT) is a condition in which a blood clot forms in a deep vein, such as a lower leg, thigh, or arm vein. A clot is blood that  has thickened into a gel or solid. This condition is dangerous. It can lead to serious and even life-threatening complications if the clot travels to the lungs and causes a blockage (pulmonary embolism). It can also damage veins in the leg. This can result in leg pain, swelling, discoloration, and sores (post-thrombotic syndrome). What are the causes? This condition may be caused by:  A slowdown of blood flow.  Damage to a vein.  A condition that causes blood to clot more easily, such as an inherited clotting disorder. What increases the risk? The following factors may make you more likely to develop this condition:  Being overweight.  Being older, especially over age 47.  Sitting or lying down for more than four hours.  Being in the hospital.  Lack of physical activity (sedentary lifestyle).  Pregnancy, being in childbirth, or having recently given birth.  Taking medicines that contain estrogen, such as medicines to prevent pregnancy.  Smoking.  A history of any of the following: ? Blood clots or a blood clotting disease. ? Peripheral vascular disease. ? Inflammatory bowel disease. ? Cancer. ? Heart disease. ? Genetic conditions that affect how your blood clots, such as Factor V Leiden mutation. ? Neurological diseases that affect your legs (leg paresis). ? A recent injury, such as a car accident. ? Major or lengthy surgery. ? A central line placed inside a large vein. What are the signs or symptoms? Symptoms of this condition include:  Swelling, pain,  or tenderness in an arm or leg.  Warmth, redness, or discoloration in an arm or leg. If the clot is in your leg, symptoms may be more noticeable or worse when you stand or walk. Some people may not develop any symptoms. How is this diagnosed? This condition is diagnosed with:  A medical history and physical exam.  Tests, such as: ? Blood tests. These are done to check how well your blood clots. ? Ultrasound. This is done to check for clots. ? Venogram. For this test, contrast dye is injected into a vein and X-rays are taken to check for any clots. How is this treated? Treatment for this condition depends on:  The cause of your DVT.  Your risk for bleeding or developing more clots.  Any other medical conditions that you have. Treatment may include:  Taking a blood thinner (anticoagulant). This type of medicine prevents clots from forming. It may be taken by mouth, injected under the skin, or injected through an IV (catheter).  Injecting clot-dissolving medicines into the affected vein (catheter-directed thrombolysis).  Having surgery. Surgery may be done to: ? Remove the clot. ? Place a filter in a large vein to catch blood clots before they reach the lungs. Some treatments may be continued for up to six months. Follow these instructions at home: If you are taking blood thinners:  Take the medicine exactly as told by your health care provider. Some blood thinners need to be taken at the same time every day. Do not skip a dose.  Talk with your health care provider before you take any medicines that contain aspirin or NSAIDs. These medicines increase your risk for dangerous bleeding.  Ask your health care provider about foods and drugs that could change the way the medicine works (may interact). Avoid those things if your health care provider tells you to do so.  Blood thinners can cause easy bruising and may make it difficult to stop bleeding. Because of this: ? Be very careful  when using knives, scissors,  or other sharp objects. ? Use an electric razor instead of a blade. ? Avoid activities that could cause injury or bruising, and follow instructions about how to prevent falls.  Wear a medical alert bracelet or carry a card that lists what medicines you take. General instructions  Take over-the-counter and prescription medicines only as told by your health care provider.  Return to your normal activities as told by your health care provider. Ask your health care provider what activities are safe for you.  Wear compression stockings if recommended by your health care provider.  Keep all follow-up visits as told by your health care provider. This is important. How is this prevented? To lower your risk of developing this condition again:  For 30 or more minutes every day, do an activity that: ? Involves moving your arms and legs. ? Increases your heart rate.  When traveling for longer than four hours: ? Exercise your arms and legs every hour. ? Drink plenty of water. ? Avoid drinking alcohol.  Avoid sitting or lying for a long time without moving your legs.  If you have surgery or you are hospitalized, ask about ways to prevent blood clots. These may include taking frequent walks or using anticoagulants.  Stay at a healthy weight.  If you are a woman who is older than age 70, avoid unnecessary use of medicines that contain estrogen, such as some birth control pills.  Do not use any products that contain nicotine or tobacco, such as cigarettes and e-cigarettes. This is especially important if you take estrogen medicines. If you need help quitting, ask your health care provider. Contact a health care provider if:  You miss a dose of your blood thinner.  Your menstrual period is heavier than usual.  You have unusual bruising. Get help right away if:  You have: ? New or increased pain, swelling, or redness in an arm or leg. ? Numbness or tingling in  an arm or leg. ? Shortness of breath. ? Chest pain. ? A rapid or irregular heartbeat. ? A severe headache or confusion. ? A cut that will not stop bleeding.  There is blood in your vomit, stool, or urine.  You have a serious fall or accident, or you hit your head.  You feel light-headed or dizzy.  You cough up blood. These symptoms may represent a serious problem that is an emergency. Do not wait to see if the symptoms will go away. Get medical help right away. Call your local emergency services (911 in the [place] Do not drive yourself to the hospital. Summary  Deep vein thrombosis (DVT) is a condition in which a blood clot forms in a deep vein, such as a lower leg, thigh, or arm vein.  Symptoms can include swelling, warmth, pain, and redness in your leg or arm.  This condition may be treated with a blood thinner (anticoagulant medicine), medicine that is injected to dissolve blood clots,compression stockings, or surgery.  If you are prescribed blood thinners, take them exactly as told. This information is not intended to replace advice given to you by your health care provider. Make sure you discuss any questions you have with your health care provider. Document Revised: 02/15/2017 Document Reviewed: 08/03/2016 Elsevier Patient Education  [employer].    If you have lab work done today you will be contacted with your lab results within the next 2 weeks.  If you have not heard from [place] then please contact us. The fastest way to  get your results is to register for My Chart.   IF you received an x-ray today, you will receive an invoice from [facility]. Please contact [facility] at [phone] with questions or concerns regarding your invoice.   IF you received labwork today, you will receive an invoice from [place]. Please contact [facility] at [phone] with questions or concerns regarding your invoice.   Our billing staff will not be able to assist  you with questions regarding bills from these companies.  You will be contacted with the lab results as soon as they are available. The fastest way to get your results is to activate your My Chart account. Instructions are located on the last page of this paperwork. If you have not heard from [place] regarding the results in 2 weeks, please contact this office.         Signed, Meredith Staggers, MD [facility]

## 2020-01-15 NOTE — Patient Instructions (Addendum)
I will refer you to hematology. Continue Eliquis at 5mg  twice per day after completion of starter pack.   Return to the clinic or go to the nearest emergency room if any of your symptoms worsen or new symptoms occur.   Deep Vein Thrombosis  Deep vein thrombosis (DVT) is a condition in which a blood clot forms in a deep vein, such as a lower leg, thigh, or arm vein. A clot is blood that has thickened into a gel or solid. This condition is dangerous. It can lead to serious and even life-threatening complications if the clot travels to the lungs and causes a blockage (pulmonary embolism). It can also damage veins in the leg. This can result in leg pain, swelling, discoloration, and sores (post-thrombotic syndrome). What are the causes? This condition may be caused by:  A slowdown of blood flow.  Damage to a vein.  A condition that causes blood to clot more easily, such as an inherited clotting disorder. What increases the risk? The following factors may make you more likely to develop this condition:  Being overweight.  Being older, especially over age 44.  Sitting or lying down for more than four hours.  Being in the hospital.  Lack of physical activity (sedentary lifestyle).  Pregnancy, being in childbirth, or having recently given birth.  Taking medicines that contain estrogen, such as medicines to prevent pregnancy.  Smoking.  A history of any of the following: ? Blood clots or a blood clotting disease. ? Peripheral vascular disease. ? Inflammatory bowel disease. ? Cancer. ? Heart disease. ? Genetic conditions that affect how your blood clots, such as Factor V Leiden mutation. ? Neurological diseases that affect your legs (leg paresis). ? A recent injury, such as a car accident. ? Major or lengthy surgery. ? A central line placed inside a large vein. What are the signs or symptoms? Symptoms of this condition include:  Swelling, pain, or tenderness in an arm or  leg.  Warmth, redness, or discoloration in an arm or leg. If the clot is in your leg, symptoms may be more noticeable or worse when you stand or walk. Some people may not develop any symptoms. How is this diagnosed? This condition is diagnosed with:  A medical history and physical exam.  Tests, such as: ? Blood tests. These are done to check how well your blood clots. ? Ultrasound. This is done to check for clots. ? Venogram. For this test, contrast dye is injected into a vein and X-rays are taken to check for any clots. How is this treated? Treatment for this condition depends on:  The cause of your DVT.  Your risk for bleeding or developing more clots.  Any other medical conditions that you have. Treatment may include:  Taking a blood thinner (anticoagulant). This type of medicine prevents clots from forming. It may be taken by mouth, injected under the skin, or injected through an IV (catheter).  Injecting clot-dissolving medicines into the affected vein (catheter-directed thrombolysis).  Having surgery. Surgery may be done to: ? Remove the clot. ? Place a filter in a large vein to catch blood clots before they reach the lungs. Some treatments may be continued for up to six months. Follow these instructions at home: If you are taking blood thinners:  Take the medicine exactly as told by your health care provider. Some blood thinners need to be taken at the same time every day. Do not skip a dose.  Talk with your health care  provider before you take any medicines that contain aspirin or NSAIDs. These medicines increase your risk for dangerous bleeding.  Ask your health care provider about foods and drugs that could change the way the medicine works (may interact). Avoid those things if your health care provider tells you to do so.  Blood thinners can cause easy bruising and may make it difficult to stop bleeding. Because of this: ? Be very careful when using knives,  scissors, or other sharp objects. ? Use an electric razor instead of a blade. ? Avoid activities that could cause injury or bruising, and follow instructions about how to prevent falls.  Wear a medical alert bracelet or carry a card that lists what medicines you take. General instructions  Take over-the-counter and prescription medicines only as told by your health care provider.  Return to your normal activities as told by your health care provider. Ask your health care provider what activities are safe for you.  Wear compression stockings if recommended by your health care provider.  Keep all follow-up visits as told by your health care provider. This is important. How is this prevented? To lower your risk of developing this condition again:  For 30 or more minutes every day, do an activity that: ? Involves moving your arms and legs. ? Increases your heart rate.  When traveling for longer than four hours: ? Exercise your arms and legs every hour. ? Drink plenty of water. ? Avoid drinking alcohol.  Avoid sitting or lying for a long time without moving your legs.  If you have surgery or you are hospitalized, ask about ways to prevent blood clots. These may include taking frequent walks or using anticoagulants.  Stay at a healthy weight.  If you are a woman who is older than age 39, avoid unnecessary use of medicines that contain estrogen, such as some birth control pills.  Do not use any products that contain nicotine or tobacco, such as cigarettes and e-cigarettes. This is especially important if you take estrogen medicines. If you need help quitting, ask your health care provider. Contact a health care provider if:  You miss a dose of your blood thinner.  Your menstrual period is heavier than usual.  You have unusual bruising. Get help right away if:  You have: ? New or increased pain, swelling, or redness in an arm or leg. ? Numbness or tingling in an arm or  leg. ? Shortness of breath. ? Chest pain. ? A rapid or irregular heartbeat. ? A severe headache or confusion. ? A cut that will not stop bleeding.  There is blood in your vomit, stool, or urine.  You have a serious fall or accident, or you hit your head.  You feel light-headed or dizzy.  You cough up blood. These symptoms may represent a serious problem that is an emergency. Do not wait to see if the symptoms will go away. Get medical help right away. Call your local emergency services (911 in the [place] Do not drive yourself to the hospital. Summary  Deep vein thrombosis (DVT) is a condition in which a blood clot forms in a deep vein, such as a lower leg, thigh, or arm vein.  Symptoms can include swelling, warmth, pain, and redness in your leg or arm.  This condition may be treated with a blood thinner (anticoagulant medicine), medicine that is injected to dissolve blood clots,compression stockings, or surgery.  If you are prescribed blood thinners, take them exactly as told. This information  is not intended to replace advice given to you by your health care provider. Make sure you discuss any questions you have with your health care provider. Document Revised: 02/15/2017 Document Reviewed: 08/03/2016 Elsevier Patient Education  [employer].    If you have lab work done today you will be contacted with your lab results within the next 2 weeks.  If you have not heard from [place] then please contact us. The fastest way to get your results is to register for My Chart.   IF you received an x-ray today, you will receive an invoice from [facility]. Please contact [facility] at [phone] with questions or concerns regarding your invoice.   IF you received labwork today, you will receive an invoice from [place]. Please contact [facility] at [phone] with questions or concerns regarding your invoice.   Our billing staff will not be able to assist you with  questions regarding bills from these companies.  You will be contacted with the lab results as soon as they are available. The fastest way to get your results is to activate your My Chart account. Instructions are located on the last page of this paperwork. If you have not heard from [place] regarding the results in 2 weeks, please contact this office.

## 2020-01-18 ENCOUNTER — Telehealth: Payer: Self-pay | Admitting: Hematology and Oncology

## 2020-01-18 ENCOUNTER — Encounter: Payer: Self-pay | Admitting: Family Medicine

## 2020-01-18 NOTE — Telephone Encounter (Signed)
Received a new hem referral from Dr. Neva Seat at [facility] for DVT. Mr. Steven Hudson has been cld and scheduled to see Dr. Pamelia Hoit on 11/30 at 830am. Pt aware to arrive 30 minutes early.

## 2020-01-21 ENCOUNTER — Encounter: Payer: Self-pay | Admitting: *Deleted

## 2020-01-22 ENCOUNTER — Telehealth: Payer: Self-pay | Admitting: Family Medicine

## 2020-01-22 NOTE — Telephone Encounter (Signed)
Patient is calling to ask advice to the mychart message regarding is is ok to fly and go to the beach while on blood thinners? Cb- [phone]

## 2020-01-22 NOTE — Telephone Encounter (Signed)
Pt is okay to fly blood thinners will not affect his ability to fly.   Called pt to inform him no answer left message stating this

## 2020-01-22 NOTE — Telephone Encounter (Signed)
Patient is calling back again regarding his my chart message request  Patient is wanting a call

## 2020-01-28 NOTE — Telephone Encounter (Signed)
Pt fly in today 01/28/20 and he wants to drive to beach tomorrow 09/62/83. Is this OK? Pt would like a to be responded to my chart

## 2020-01-29 ENCOUNTER — Telehealth: Payer: Self-pay | Admitting: Emergency Medicine

## 2020-01-29 NOTE — Telephone Encounter (Signed)
Patient called asking was it ok for him to travel to the beach in a car while on Elliquis? Spoke to Dr Neva Seat and he stated patient can travel in a care as long as he is on a blood thinner regiment and he do not have a newly diagnosis of  blood clot. Patient stated he has been on Elliquis for 4 weeks now.

## 2020-02-09 NOTE — Progress Notes (Signed)
[facility] CONSULT NOTE  Patient Care Team: Shade Flood, MD as PCP - General (Family Medicine)  CHIEF COMPLAINTS/PURPOSE OF CONSULTATION:  Newly diagnosed lower extremity DVT  HISTORY OF PRESENTING ILLNESS:  Steven Hudson 49 y.o. male is here because of recent diagnosis of DVT. He is referred by Dr. Neva Seat at [facility]. He presented to the ED on 01/08/20 for acute right calf pain, and [place] on 01/08/20 showed acute occlusive deep venous thrombosis involving the peroneal vein at the right calf. He was discharged on Eliquis. He presents to the clinic today for initial evaluation.  Patient lost about 80 pounds over the past several months and had gained a few pounds back but he was wondering if it is the cause of his blood clots.  He was also more sedentary and recently came back from the [facility] area after driving straight through.  He does not smoke cigarettes.  He has changed his diet to eat organic grass fed meats and eats plenty of vegetables as well. His mother died from leukemia and apparently had pulmonary embolism.  I reviewed her records extensively and collaborated the history with the patient.  MEDICAL HISTORY:  No past medical history on file.  SURGICAL HISTORY: Past Surgical History:  Procedure Laterality Date  . EYE SURGERY      SOCIAL HISTORY: Denies any tobacco or alcohol or recreational drug use FAMILY HISTORY: Family History  Problem Relation Age of Onset  . Hypertension Mother   . Leukemia Mother   . Cancer Mother   . Diabetes Father   . Hypertension Brother   . Heart disease Maternal Grandmother   . Heart disease Maternal Grandfather   . Diabetes Paternal Grandfather     ALLERGIES:  has No Known Allergies.  MEDICATIONS:  Current Outpatient Medications  Medication Sig Dispense Refill  . apixaban (ELIQUIS) 5 MG TABS tablet Take 1 tablet (5 mg total) by mouth 2 (two) times daily. 60 tablet 2  . APIXABAN (ELIQUIS) VTE  STARTER PACK (10MG  AND 5MG ) Take as directed on package: start with two-5mg  tablets twice daily for 7 days. On day 8, switch to one-5mg  tablet twice daily. 1 each 0   No current facility-administered medications for this visit.    REVIEW OF SYSTEMS:     All other systems were reviewed with the patient and are negative.  PHYSICAL EXAMINATION: ECOG PERFORMANCE STATUS: 1 - Symptomatic but completely ambulatory  There were no vitals filed for this visit. There were no vitals filed for this visit.     LABORATORY DATA:  I have reviewed the data as listed Lab Results  Component Value Date   WBC 8.9 01/08/2020   HGB 15.7 01/08/2020   HCT 45.8 01/08/2020   MCV 84.8 01/08/2020   PLT 183 01/08/2020   Lab Results  Component Value Date   NA 137 01/08/2020   K 3.7 01/08/2020   CL 102 01/08/2020   CO2 25 01/08/2020    RADIOGRAPHIC STUDIES: I have personally reviewed the radiological reports and agreed with the findings in the report.  ASSESSMENT AND PLAN:  Right leg DVT (HCC) 01/08/20: DVT involving peroneal Vein at right calf.  I discussed with the patient risk factors for blood clots.  Inherited risk factors include: 1. Factor V Leiden mutation 2. Prothrombin gene G20210A 3. Protein S deficiency  4. Protein C deficiency  5. Antithrombin deficiency  Acquired risk factors include: 1. Antiphospholipid antibody syndrome: We will check 2. Tobacco use:  Denies 3. Obesity: Patient lost 80 pounds and is trying to lose more weight. 4. Sedentary behavior including postoperative state: Patient does sit at home and work but tries to move around 5. Foreign bodies in circulation: None  Workup recommended: Bloodwork to evaluate for the 5 inherited factors mentioned above along with antiphospholipid antibodies. Return to clinic in one to 2 weeks to discuss the results of the tests through MyChart virtual visit    All questions were answered. The patient knows to call the clinic with  any problems, questions or concerns.   Sabas Sous, MD, MPH 02/10/2020    I, Molly Dorshimer, am acting as scribe for Serena Croissant, MD.  I have reviewed the above documentation for accuracy and completeness, and I agree with the above.

## 2020-02-10 ENCOUNTER — Telehealth: Payer: Self-pay | Admitting: Hematology and Oncology

## 2020-02-10 ENCOUNTER — Inpatient Hospital Stay: Payer: BC Managed Care – PPO

## 2020-02-10 ENCOUNTER — Inpatient Hospital Stay: Payer: BC Managed Care – PPO | Attending: Hematology and Oncology | Admitting: Hematology and Oncology

## 2020-02-10 ENCOUNTER — Other Ambulatory Visit: Payer: Self-pay

## 2020-02-10 DIAGNOSIS — Z806 Family history of leukemia: Secondary | ICD-10-CM | POA: Insufficient documentation

## 2020-02-10 DIAGNOSIS — I82461 Acute embolism and thrombosis of right calf muscular vein: Secondary | ICD-10-CM | POA: Diagnosis not present

## 2020-02-10 DIAGNOSIS — Z7901 Long term (current) use of anticoagulants: Secondary | ICD-10-CM | POA: Insufficient documentation

## 2020-02-10 DIAGNOSIS — I82401 Acute embolism and thrombosis of unspecified deep veins of right lower extremity: Secondary | ICD-10-CM | POA: Insufficient documentation

## 2020-02-10 NOTE — Telephone Encounter (Signed)
Scheduled appts per 11/24 los. Gave pt a print out of AVS.  

## 2020-02-10 NOTE — Assessment & Plan Note (Signed)
01/08/20: DVT involving peroneal Vein at right calf.  I discussed with the patient risk factors for blood clots.  Inherited risk factors include: 1. Factor V Leiden mutation 2. Prothrombin gene G20210A 3. Protein S deficiency  4. Protein C deficiency  5. Antithrombin deficiency  Acquired risk factors include: 1. Antiphospholipid antibody syndrome 2. Tobacco use 3. Obesity 4. Sedentary behavior including postoperative state 5. Foreign bodies in circulation  Workup recommended: Bloodwork to evaluate for the 5 inherited factors mentioned above along with antiphospholipid antibodies. Return to clinic in one to 2 weeks to discuss the results of the tests

## 2020-02-11 LAB — BETA-2-GLYCOPROTEIN I ABS, IGG/M/A
Beta-2 Glyco I IgG: <9 GPI IgG units (ref 0–20)
Beta-2-Glycoprotein I IgA: <9 GPI IgA units (ref 0–25)
Beta-2-Glycoprotein I IgM: <9 GPI IgM units (ref 0–32)

## 2020-02-12 LAB — ANTITHROMBIN III ANTIGEN: AT III AG PPP IMM-ACNC: 70 % — ABNORMAL LOW (ref 72–124)

## 2020-02-12 LAB — PROTEIN S, TOTAL: Protein S Ag, Total: 86 % (ref 60–150)

## 2020-02-13 LAB — DRVVT CONFIRM: dRVVT Confirm: 1.1 ratio (ref 0.8–1.2)

## 2020-02-13 LAB — PROTEIN C, TOTAL: Protein C, Total: 100 % (ref 60–150)

## 2020-02-13 LAB — CARDIOLIPIN ANTIBODIES, IGG, IGM, IGA
Anticardiolipin IgA: <9 APL U/mL (ref 0–11)
Anticardiolipin IgG: <9 GPL U/mL (ref 0–14)
Anticardiolipin IgM: <9 MPL U/mL (ref 0–12)

## 2020-02-13 LAB — LUPUS ANTICOAGULANT PANEL
DRVVT: 50.1 s — ABNORMAL HIGH (ref 0.0–47.0)
PTT Lupus Anticoagulant: 34.3 s (ref 0.0–51.9)

## 2020-02-13 LAB — DRVVT MIX: dRVVT Mix: 42.1 s — ABNORMAL HIGH (ref 0.0–40.4)

## 2020-02-15 LAB — FACTOR 5 LEIDEN

## 2020-02-15 LAB — PROTHROMBIN GENE MUTATION

## 2020-02-16 ENCOUNTER — Ambulatory Visit: Payer: BC Managed Care – PPO

## 2020-02-16 ENCOUNTER — Encounter: Payer: BC Managed Care – PPO | Admitting: Hematology and Oncology

## 2020-02-18 NOTE — Assessment & Plan Note (Signed)
01/08/20: DVT involving peroneal Vein at right calf. Hypercoagulability panel: Negative

## 2020-02-18 NOTE — Progress Notes (Signed)
Steven Hudson I can see you whole face instead of a half of with the mask HEMATOLOGY-ONCOLOGY [facility] VIDEO VISIT PROGRESS NOTE  I connected with Steven Hudson on 02/19/2020 at 10:45 AM EST by MyChart video conference and verified that I am speaking with the correct person using two identifiers.  I discussed the limitations, risks, security and privacy concerns of performing an evaluation and management service by MyChart and the availability of in person appointments.  I also discussed with the patient that there may be a patient responsible charge related to this service. The patient expressed understanding and agreed to proceed.  Patient's Location: Home Physician Location: Clinic  CHIEF COMPLIANT: History of DVT  INTERVAL HISTORY: Steven Hudson is a 49 y.o. male with above-mentioned history of lower extremity DVT currently on Eliquis. He presents over MyChart today to review his labs.  He is tolerating Eliquis extremely well without any problems or concerns.  Observations/Objective:  There were no vitals filed for this visit. There is no height or weight on file to calculate BMI.  I have reviewed the data as listed CMP Latest Ref Rng & Units 01/08/2020 09/07/2019 02/18/2018  Glucose 70 - 99 mg/dL 89 81 92  BUN 6 - 20 mg/dL 18 22 11   Creatinine 0.61 - 1.24 mg/dL 9.67 5.91  Sodium 135 - 145 mmol/L 137 140 137  Potassium 3.5 - 5.1 mmol/L 3.7 4.5 4.2  Chloride 98 - 111 mmol/L 102 101 100  CO2 22 - 32 mmol/L 25 22 21   Calcium 8.9 - 10.3 mg/dL 9.1 9.3 9.5  Total Protein 6.0 - 8.5 g/dL - 6.6 6.9  Total Bilirubin 0.0 - 1.2 mg/dL - 0.6 0.5  Alkaline Phos 48 - 121 IU/L - 69 56  AST 0 - 40 IU/L - 20 18  ALT 0 - 44 IU/L - 18 26    Lab Results  Component Value Date   WBC 8.9 01/08/2020   HGB 15.7 01/08/2020   HCT 45.8 01/08/2020   MCV 84.8 01/08/2020   PLT 183 01/08/2020   NEUTROABS 4.0 05/03/2007      Assessment Plan:  Right leg DVT (HCC) 01/08/20: DVT involving peroneal Vein at  right calf. Hypercoagulability panel: Negative (Antithrombin III was 70 which is not very significantly low) Lupus anticoagulant negative, protein C protein S prothrombin gene mutation, factor V Leiden were all negative  Recommended anticoagulation for 6 months.  (To be completed April and 2022) Mid April 2022 we will obtain ultrasound of his right leg After that we will have a MyChart virtual visit and see if he can discontinue his anticoagulation at that time.   I discussed the assessment and treatment plan with the patient. The patient was provided an opportunity to ask questions and all were answered. The patient agreed with the plan and demonstrated an understanding of the instructions. The patient was advised to call back or seek an in-person evaluation if the symptoms worsen or if the condition fails to improve as anticipated.   I provided 20 minutes of face-to-face MyChart video visit time and for charting and coordination of care during this encounter.    04-21-1998, MD 02/19/2020   I, Molly Dorshimer, am acting as scribe for Sabas Sous, MD.  I have reviewed the above documentation for accuracy and completeness, and I agree with the above.

## 2020-02-19 ENCOUNTER — Inpatient Hospital Stay: Payer: BC Managed Care – PPO | Attending: Hematology and Oncology | Admitting: Hematology and Oncology

## 2020-02-19 ENCOUNTER — Ambulatory Visit: Payer: BC Managed Care – PPO | Attending: Internal Medicine

## 2020-02-19 DIAGNOSIS — I82461 Acute embolism and thrombosis of right calf muscular vein: Secondary | ICD-10-CM | POA: Diagnosis not present

## 2020-02-19 DIAGNOSIS — Z23 Encounter for immunization: Secondary | ICD-10-CM

## 2020-02-19 NOTE — Progress Notes (Signed)
° °  Covid-19 Vaccination Clinic  Name:  SASCHA BAUGHER    MRN: [id number] DOB: 1970/05/13  02/19/2020  Mr. Matheney was observed post Covid-19 immunization for 15 minutes without incident. He was provided with Vaccine Information Sheet and instruction to access the V-Safe system.   Mr. Mahabir was instructed to call 911 with any severe reactions post vaccine:  Difficulty breathing   Swelling of face and throat   A fast heartbeat   A bad rash all over body   Dizziness and weakness   Immunizations Administered    Name Date Dose VIS Date Route   Pfizer COVID-19 Vaccine 02/19/2020  2:07 PM 0.3 mL 01/06/2020 Intramuscular   Manufacturer: [employer], [employer]   Lot: [id number]   NDC: [address]-8

## 2020-02-29 ENCOUNTER — Telehealth: Payer: BC Managed Care – PPO | Admitting: Hematology and Oncology

## 2020-03-01 ENCOUNTER — Telehealth: Payer: Self-pay | Admitting: Hematology and Oncology

## 2020-03-01 NOTE — Telephone Encounter (Signed)
Scheduled per 12/3 los. Called pt and left a msg

## 2020-03-31 ENCOUNTER — Telehealth: Payer: Self-pay | Admitting: Family Medicine

## 2020-03-31 NOTE — Telephone Encounter (Signed)
Spoke with patient advised after speaking to the pharmacy this is the price until his deductible is met for this year. Advised to go to [employer] site to see if eligible for a co-pay card.     Patient will check and if he can't get a discounted rate will call back for different options.

## 2020-03-31 NOTE — Telephone Encounter (Signed)
Patient is stating that he needs Prior Auth to get his  apixaban (ELIQUIS) 5 MG TABS tablet [[id number]  [id number] - [place], [place] - [address] AT [facility] [address] [facility] [facility] [address]  [address] [place] [address]  Phone:  [phone] Fax:  [phone]    The pharmacy is trying to charge him 492.00   Patient is needing Julies healp again   Please reach  out  To  [phone]

## 2020-04-05 NOTE — Telephone Encounter (Signed)
Spoke with patient was able to get coupon through [employer]. Will Call Hematology to have this prescription filled

## 2020-04-25 ENCOUNTER — Other Ambulatory Visit: Payer: Self-pay | Admitting: *Deleted

## 2020-04-25 DIAGNOSIS — I82461 Acute embolism and thrombosis of right calf muscular vein: Secondary | ICD-10-CM

## 2020-04-25 DIAGNOSIS — I82451 Acute embolism and thrombosis of right peroneal vein: Secondary | ICD-10-CM

## 2020-04-25 MED ORDER — APIXABAN 5 MG PO TABS: 5.0000 mg | ORAL_TABLET | Freq: Two times a day (BID) | ORAL | 2 refills | Status: DC

## 2020-06-24 ENCOUNTER — Ambulatory Visit (HOSPITAL_COMMUNITY)
Admission: RE | Admit: 2020-06-24 | Discharge: 2020-06-24 | Disposition: A | Discharge status: Home / Self Care | Payer: BC Managed Care – PPO | Source: Ambulatory Visit | Attending: Hematology and Oncology | Admitting: Hematology and Oncology

## 2020-06-24 ENCOUNTER — Other Ambulatory Visit: Payer: Self-pay

## 2020-06-24 DIAGNOSIS — I82461 Acute embolism and thrombosis of right calf muscular vein: Secondary | ICD-10-CM | POA: Insufficient documentation

## 2020-06-24 NOTE — CV Procedure (Signed)
RLE venous duplex completed.  Results can be found under chart review under CV PROC. 06/24/2020 12:11 PM Kathie Posa RVT, RDMS

## 2020-07-10 NOTE — Assessment & Plan Note (Signed)
01/08/20: DVT involving peroneal Vein at right calf. Hypercoagulability panel: Negative (Antithrombin III was 70 which is not very significantly low) Lupus anticoagulant negative, protein C protein S prothrombin gene mutation, factor V Leiden were all negative  Recommended anticoagulation for 6 months.  (To be completed April and 2022)

## 2020-07-11 ENCOUNTER — Inpatient Hospital Stay: Payer: BC Managed Care – PPO | Attending: Hematology and Oncology | Admitting: Hematology and Oncology

## 2020-07-11 DIAGNOSIS — I82461 Acute embolism and thrombosis of right calf muscular vein: Secondary | ICD-10-CM | POA: Diagnosis not present

## 2020-07-11 NOTE — Progress Notes (Signed)
  HEMATOLOGY-ONCOLOGY MYCHART VIDEO VISIT PROGRESS NOTE TELEPHONE VISIT because patient was not aware of the virtual visit    Patient's Location: Home Physician Location: Clinic  CHIEF COMPLIANT: Follow-up of history of DVT on Eliquis  INTERVAL HISTORY: Steven Hudson is a 50 y.o. male with above-mentioned history of lower extremity DVT currently on Eliquis. Venous US on 06/24/20 of lower extremities showed resolution of the previously noted thrombus and no evidence of DVT. He presents over MyChart today to review his labs.  No issues or concerns with Eliquis.  Observations/Objective:  There were no vitals filed for this visit. There is no height or weight on file to calculate BMI.  I have reviewed the data as listed CMP Latest Ref Rng & Units 01/08/2020 09/07/2019 02/18/2018  Glucose 70 - 99 mg/dL 89 81 92  BUN 6 - 20 mg/dL 18 22 11   Creatinine 0.61 - 1.24 mg/dL 1.02 7.25  Sodium 135 - 145 mmol/L 137 140 137  Potassium 3.5 - 5.1 mmol/L 3.7 4.5 4.2  Chloride 98 - 111 mmol/L 102 101 100  CO2 22 - 32 mmol/L 25 22 21   Calcium 8.9 - 10.3 mg/dL 9.1 9.3 9.5  Total Protein 6.0 - 8.5 g/dL - 6.6 6.9  Total Bilirubin 0.0 - 1.2 mg/dL - 0.6 0.5  Alkaline Phos 48 - 121 IU/L - 69 56  AST 0 - 40 IU/L - 20 18  ALT 0 - 44 IU/L - 18 26    Lab Results  Component Value Date   WBC 8.9 01/08/2020   HGB 15.7 01/08/2020   HCT 45.8 01/08/2020   MCV 84.8 01/08/2020   PLT 183 01/08/2020   NEUTROABS 4.0 05/03/2007      Assessment Plan:  Right leg DVT (HCC) 01/08/20: DVT involving peroneal Vein at right calf. Hypercoagulability panel: Negative (Antithrombin III was 70 which is not very significantly low) Lupus anticoagulant negative, protein C protein S prothrombin gene mutation, factor V Leiden were all negative   06/24/2020: Ultrasound lower extremity: No evidence of blood clot Based on this result, I recommended discontinuation of further anticoagulation. I discussed with him that once  someone has a blood clot they are always at risk for an additional blood clot. He will call 01/10/20 if there are any further findings of concern. Return to clinic on an as-needed basis.  I discussed the assessment and treatment plan with the patient. The patient was provided an opportunity to ask questions and all were answered. The patient agreed with the plan and demonstrated an understanding of the instructions. The patient was advised to call back or seek an in-person evaluation if the symptoms worsen or if the condition fails to improve as anticipated.   Total time spent: 11 minutes including face-to-face MyChart video visit time and time spent for planning, charting and coordination of care  08/24/2020, MD 07/11/2020   I, Sabas Sous Dorshimer, am acting as scribe for 07/13/2020, MD.  I have reviewed the above documentation for accuracy and completeness, and I agree with the above.

## 2020-10-07 ENCOUNTER — Encounter: Payer: Self-pay | Admitting: Family Medicine

## 2020-10-07 ENCOUNTER — Telehealth (INDEPENDENT_AMBULATORY_CARE_PROVIDER_SITE_OTHER): Payer: BC Managed Care – PPO | Admitting: Family Medicine

## 2020-10-07 DIAGNOSIS — U071 COVID-19: Secondary | ICD-10-CM

## 2020-10-07 DIAGNOSIS — L739 Follicular disorder, unspecified: Secondary | ICD-10-CM | POA: Diagnosis not present

## 2020-10-07 MED ORDER — KETOCONAZOLE 2 % EX CREA: 1.0000 "application " | TOPICAL_CREAM | Freq: Every day | CUTANEOUS | 0 refills | Status: DC

## 2020-10-07 MED ORDER — MOLNUPIRAVIR EUA 200MG CAPSULE: 4.0000 | ORAL_CAPSULE | Freq: Two times a day (BID) | ORAL | 0 refills | Status: AC

## 2020-10-07 NOTE — Progress Notes (Signed)
Virtual Visit via Video Note  I connected withNAME@ on 10/07/20 at  3:30 PM EDT by a video enabled telemedicine application 2/2 COVID-19 pandemic and verified that I am speaking with the correct person using two identifiers.  Location patient: home Location provider:work or home office Persons participating in the virtual visit: patient, provider  I discussed the limitations of evaluation and management by telemedicine and the availability of in person appointments. The patient expressed understanding and agreed to proceed.   HPI: Pt had a positive COVID test on 10/06/20.  Pt had sore throat, cough, congestion, sinus HA/pressure, subjective fever, chills, achy, diarrhea, fatigue, and mild SOB with exertion.  Symptoms started on Tuesday 7/19.  Denies n/v.  Starting to feel better.  Tried Nyquil, Dayquil. Pt fully vaccinated and boosted.  Pt with erythematous, pruritic bumps on buttocks after traveling to [place].  Denies bumps elsewhere.  Endorses increased sweating.    ROS: See pertinent positives and negatives per HPI.  No past medical history on file.  Past Surgical History:  Procedure Laterality Date   EYE SURGERY      Family History  Problem Relation Age of Onset   Hypertension Mother    Leukemia Mother    Cancer Mother    Diabetes Father    Hypertension Brother    Heart disease Maternal Grandmother    Heart disease Maternal Grandfather    Diabetes Paternal Grandfather     No current outpatient medications on file.  EXAM:  VITALS per patient if applicable:  pO2 95-97%, RR between 12-20 bpm   GENERAL: alert, oriented, appears well and in no acute distress  HEENT: atraumatic, conjunctiva clear, no obvious abnormalities on inspection of external nose and ears  NECK: normal movements of the head and neck  LUNGS: on inspection no signs of respiratory distress, breathing rate appears normal, no obvious gross SOB, gasping or wheezing  Skin: Erythematous raised appearing  lesions on bilateral buttocks reviewed from picture on patient's phone.  CV: no obvious cyanosis  MS: moves all visible extremities without noticeable abnormality  PSYCH/NEURO: pleasant and cooperative, no obvious depression or anxiety, speech and thought processing grossly intact  ASSESSMENT AND PLAN:  Discussed the following assessment and plan:  COVID-19 virus infection  -Symptoms starting 10/04/2020 with positive COVID test on 10/06/2020 -Continue supportive care including rest, hydration, OTC cough/cold medications -Discussed r/b/a of antiviral medications.  Patient wishes to start molnupiravir -Given precautions - Plan: molnupiravir EUA 200 mg CAPS  Folliculitis -Can also use [employer] topical skin spray - Plan: ketoconazole (NIZORAL) 2 % cream  Follow-up as needed   I discussed the assessment and treatment plan with the patient. The patient was provided an opportunity to ask questions and all were answered. The patient agreed with the plan and demonstrated an understanding of the instructions.   The patient was advised to call back or seek an in-person evaluation if the symptoms worsen or if the condition fails to improve as anticipated.  Deeann Saint, MD

## 2021-01-06 ENCOUNTER — Telehealth: Payer: Self-pay

## 2021-01-06 NOTE — Telephone Encounter (Signed)
A PA was processed for patient for Eliquis 5mg . Patient has been approved from 11/2219/22-4/19/23

## 2021-06-16 IMAGING — US US EXTREM LOW VENOUS*R*
1 series · 13 of 24 positions shown · non-contrast
Comparison: None.

CLINICAL DATA: Initial evaluation for acute right calf pain.



[Series 1: us extrem low venous*right* · 13 of 35 slices shown]
[im 1/35]
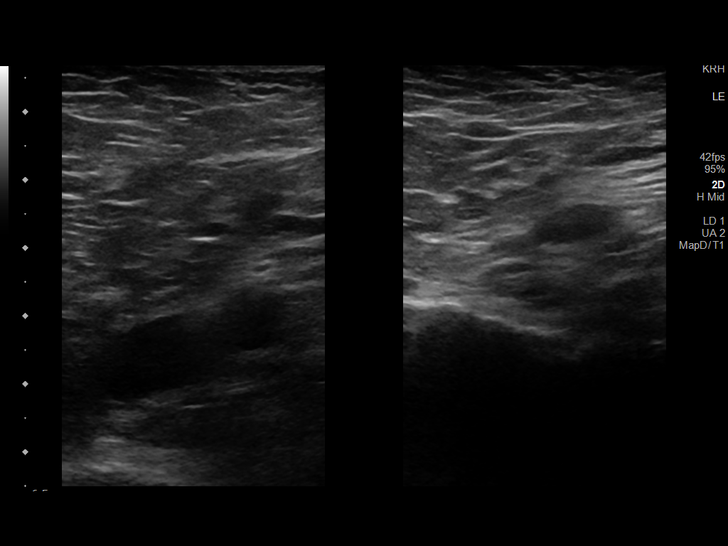
[im 3/35]
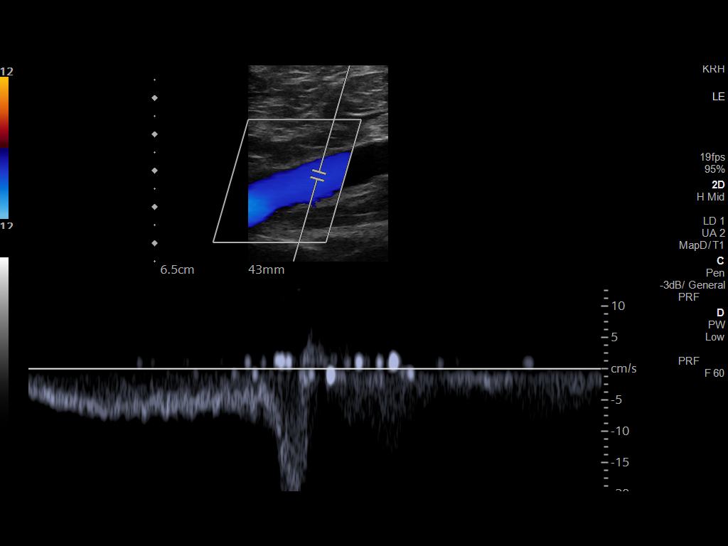
[im 6/35]
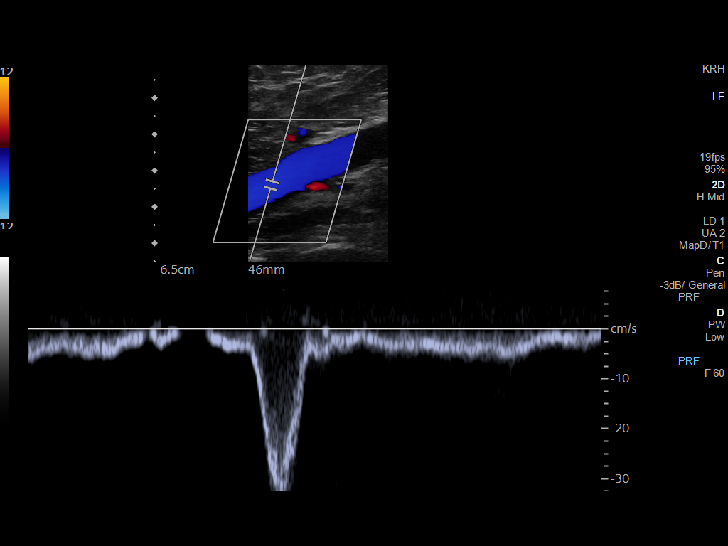
[im 9/35]
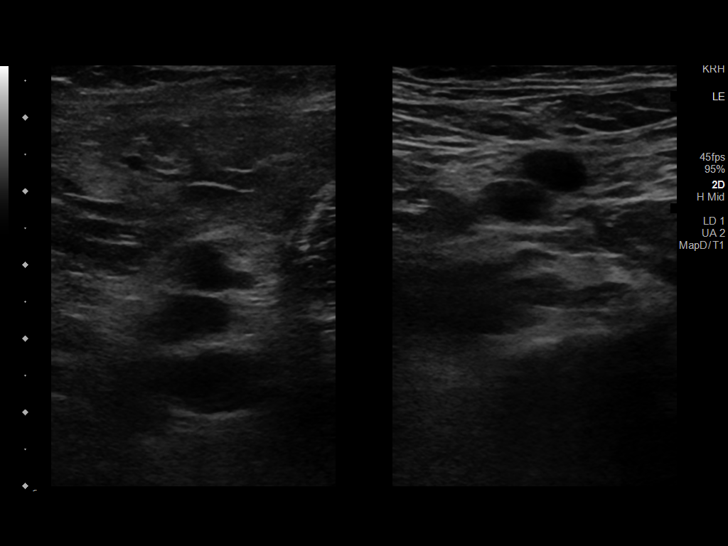
[im 12/35]
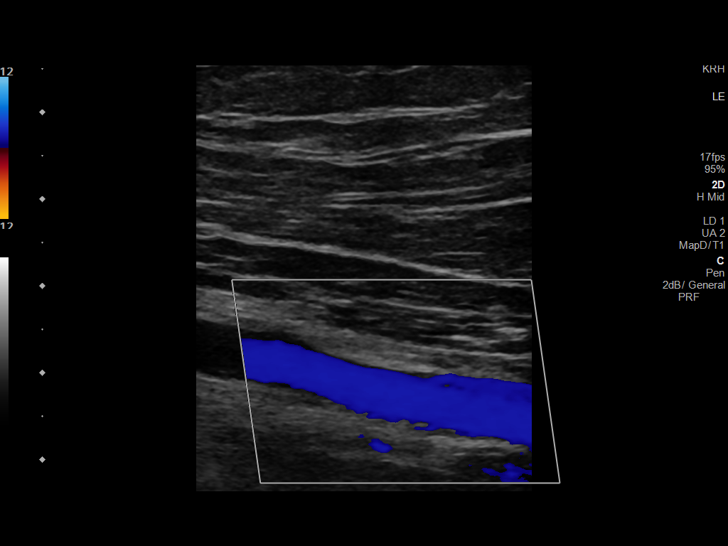
[im 15/35]
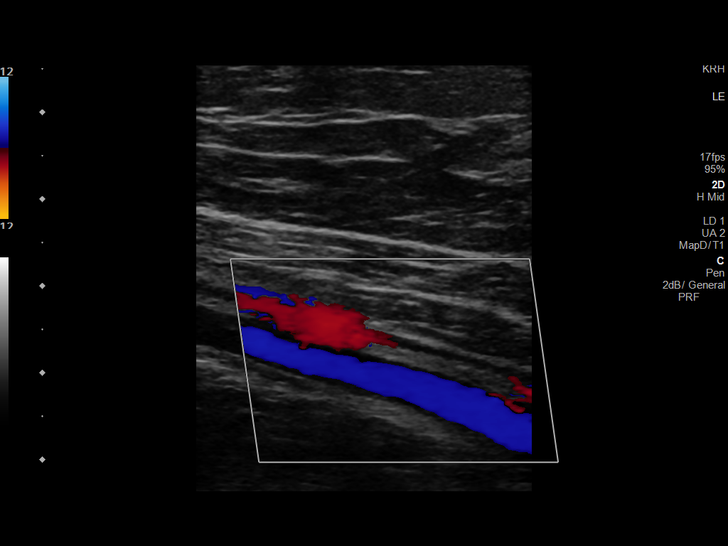
[im 18/35]
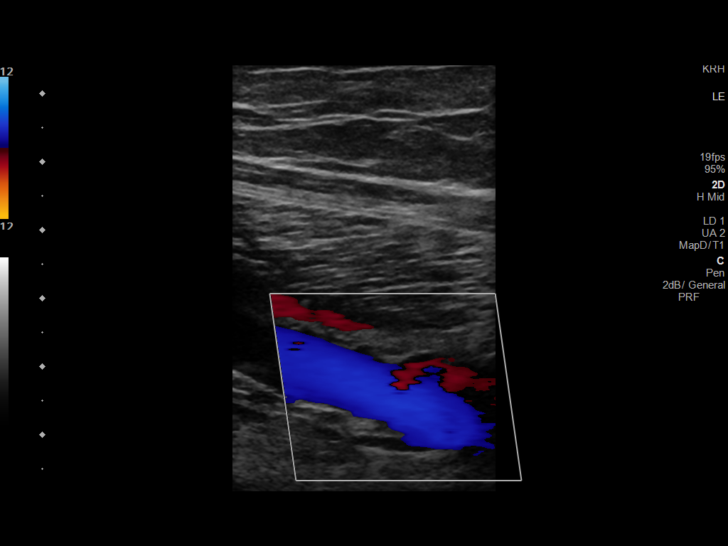
[im 20/35]
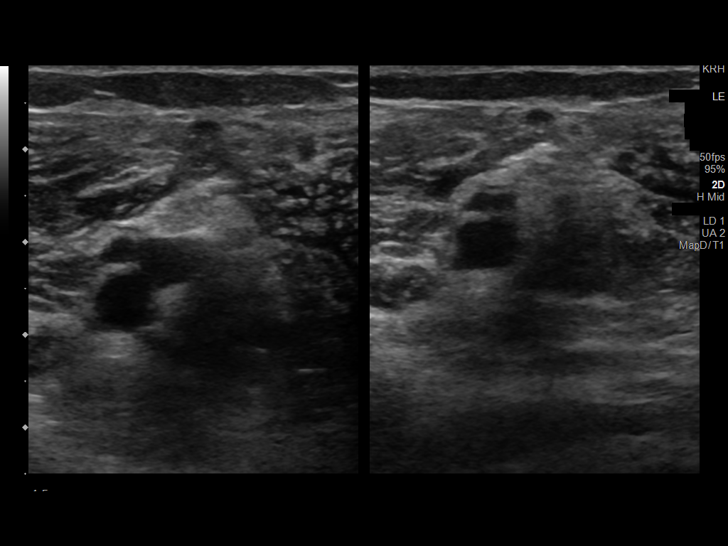
[im 23/35]
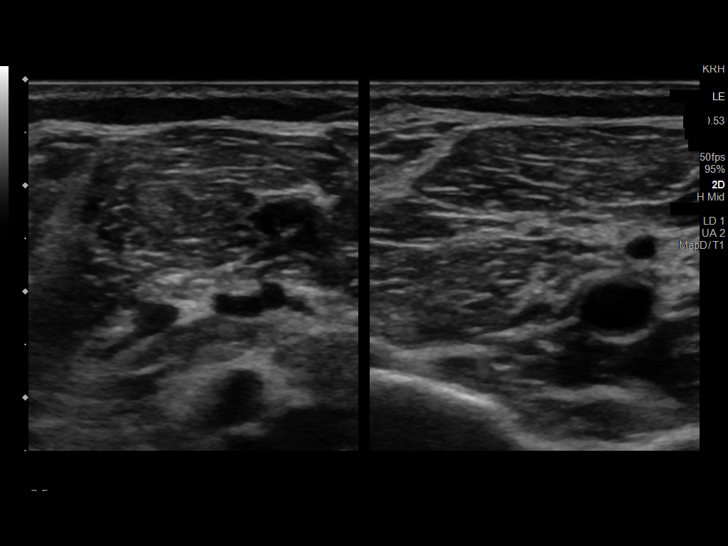
[im 26/35]
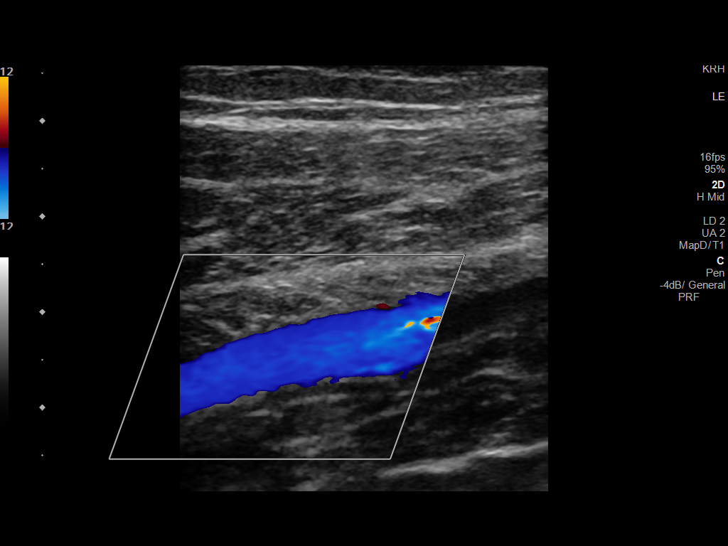
[im 29/35]
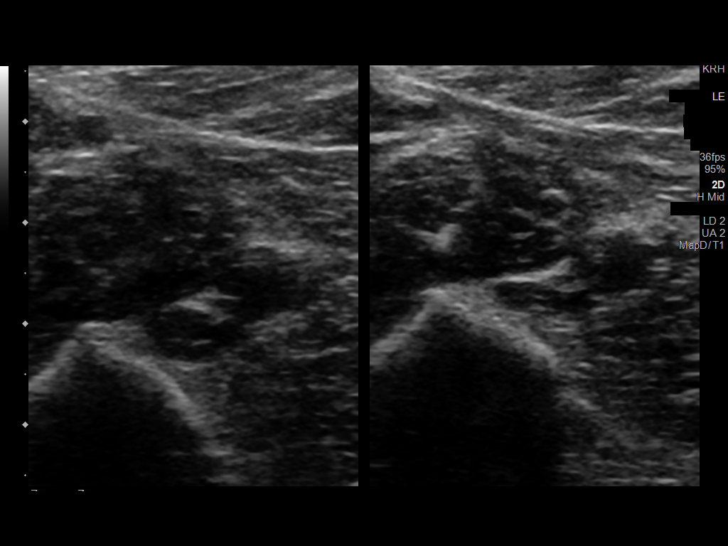
[im 32/35]
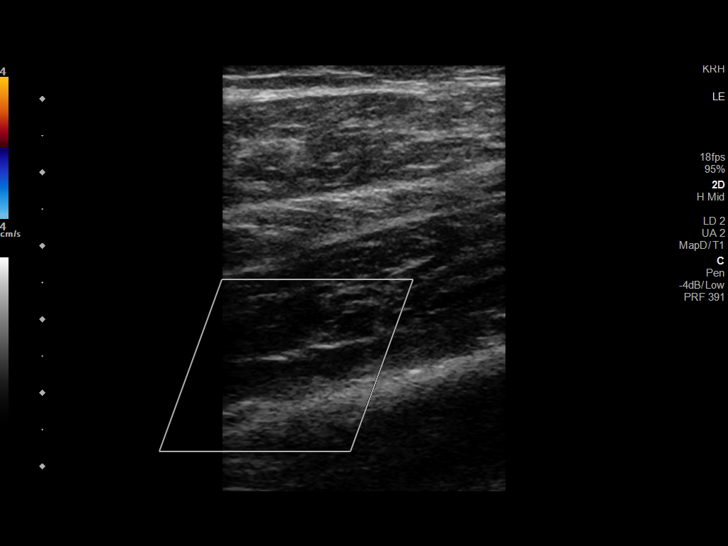
[im 35/35]
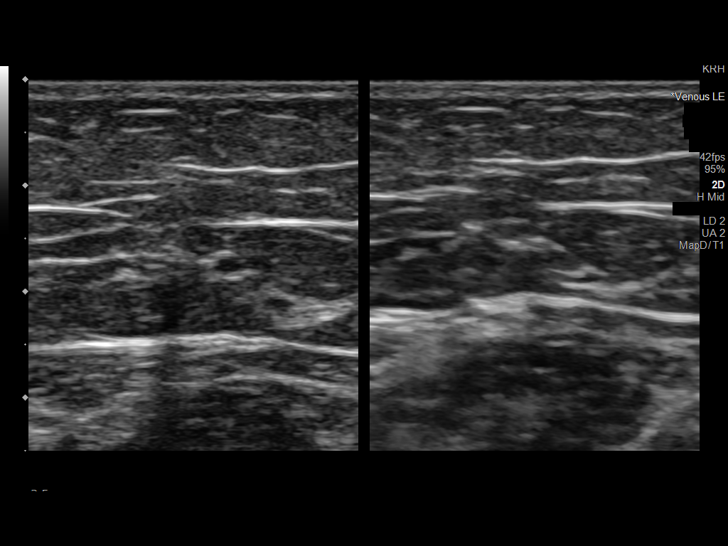

[13 of 24 positions shown; findings below may reference images not displayed]

FINDINGS: Contralateral Common Femoral Vein: Respiratory phasicity is normal
and symmetric with the symptomatic side. No evidence of thrombus.
Normal compressibility.

Common Femoral Vein: No evidence of thrombus. Normal
compressibility, respiratory phasicity and response to augmentation.

Saphenofemoral Junction: No evidence of thrombus. Normal
compressibility and flow on color Doppler imaging.

Profunda Femoral Vein: No evidence of thrombus. Normal
compressibility and flow on color Doppler imaging.

Femoral Vein: No evidence of thrombus. Normal compressibility,
respiratory phasicity and response to augmentation.

Popliteal Vein: No evidence of thrombus. Normal compressibility,
respiratory phasicity and response to augmentation.

Calf Veins: Echogenic material suggestive of thrombus seen within
the right peroneal vein at the right calf, consistent with acute
DVT. Loss of normal compressibility. This appears occlusive. The
right posterior tibial vein appears patent.

Superficial Great Saphenous Vein: No evidence of thrombus. Normal
compressibility.

Venous Reflux:  None.

Other Findings:  None.
IMPRESSION: Positive study with acute occlusive deep venous thrombosis involving
the peroneal vein at the right calf.

## 2021-06-23 ENCOUNTER — Telehealth: Payer: BC Managed Care – PPO | Admitting: Physician Assistant

## 2021-06-23 DIAGNOSIS — J019 Acute sinusitis, unspecified: Secondary | ICD-10-CM

## 2021-06-23 DIAGNOSIS — B9689 Other specified bacterial agents as the cause of diseases classified elsewhere: Secondary | ICD-10-CM

## 2021-06-23 MED ORDER — AMOXICILLIN-POT CLAVULANATE 875-125 MG PO TABS: 1.0000 | ORAL_TABLET | Freq: Two times a day (BID) | ORAL | 0 refills | Status: DC

## 2021-06-23 MED ORDER — BENZONATATE 100 MG PO CAPS: 100.0000 mg | ORAL_CAPSULE | Freq: Three times a day (TID) | ORAL | 0 refills | Status: DC | PRN

## 2021-06-23 NOTE — Patient Instructions (Signed)
Steven Hudson, thank you for joining Margaretann LovelessJennifer M Tiler Brandis, PA-C for today's virtual visit.  While this provider is not your primary care provider (PCP), if your PCP is located in our provider database this encounter information will be shared with them immediately following your visit. ? ?Consent: ?(Patient) Steven Hudson provided verbal consent for this virtual visit at the beginning of the encounter. ? ?Current Medications: ? ?Current Outpatient Medications:  ?  amoxicillin-clavulanate (AUGMENTIN) 875-125 MG tablet, Take 1 tablet by mouth 2 (two) times daily., Disp: 14 tablet, Rfl: 0 ?  benzonatate (TESSALON) 100 MG capsule, Take 1 capsule (100 mg total) by mouth 3 (three) times daily as needed., Disp: 30 capsule, Rfl: 0 ?  ketoconazole (NIZORAL) 2 % cream, Apply 1 application topically daily., Disp: 30 g, Rfl: 0  ? ?Medications ordered in this encounter:  ?Meds ordered this encounter  ?Medications  ? amoxicillin-clavulanate (AUGMENTIN) 875-125 MG tablet  ?  Sig: Take 1 tablet by mouth 2 (two) times daily.  ?  Dispense:  14 tablet  ?  Refill:  0  ?  Order Specific Question:   Supervising Provider  ?  Answer:   Eber HongMILLER, BRIAN [[id number]]  ? benzonatate (TESSALON) 100 MG capsule  ?  Sig: Take 1 capsule (100 mg total) by mouth 3 (three) times daily as needed.  ?  Dispense:  30 capsule  ?  Refill:  0  ?  Order Specific Question:   Supervising Provider  ?  Answer:   Eber HongMILLER, BRIAN [[id number]]  ?  ? ?*If you need refills on other medications prior to your next appointment, please contact your pharmacy* ? ?Follow-Up: ?Call back or seek an in-person evaluation if the symptoms worsen or if the condition fails to improve as anticipated. ? ?Other Instructions ? ?Sinusitis, Adult ?Sinusitis is inflammation of your sinuses. Sinuses are hollow spaces in the bones around your face. Your sinuses are located: ?Around your eyes. ?In the middle of your forehead. ?Behind your nose. ?In your cheekbones. ?Mucus normally drains out of your  sinuses. When your nasal tissues become inflamed or swollen, mucus can become trapped or blocked. This allows bacteria, viruses, and fungi to grow, which leads to infection. Most infections of the sinuses are caused by a virus. ?Sinusitis can develop quickly. It can last for up to 4 weeks (acute) or for more than 12 weeks (chronic). Sinusitis often develops after a cold. ?What are the causes? ?This condition is caused by anything that creates swelling in the sinuses or stops mucus from draining. This includes: ?Allergies. ?Asthma. ?Infection from bacteria or viruses. ?Deformities or blockages in your nose or sinuses. ?Abnormal growths in the nose (nasal polyps). ?Pollutants, such as chemicals or irritants in the air. ?Infection from fungi (rare). ?What increases the risk? ?You are more likely to develop this condition if you: ?Have a weak body defense system (immune system). ?Do a lot of swimming or diving. ?Overuse nasal sprays. ?Smoke. ?What are the signs or symptoms? ?The main symptoms of this condition are pain and a feeling of pressure around the affected sinuses. Other symptoms include: ?Stuffy nose or congestion. ?Thick drainage from your nose. ?Swelling and warmth over the affected sinuses. ?Headache. ?Upper toothache. ?A cough that may get worse at night. ?Extra mucus that collects in the throat or the back of the nose (postnasal drip). ?Decreased sense of smell and taste. ?Fatigue. ?A fever. ?Sore throat. ?Bad breath. ?How is this diagnosed? ?This condition is diagnosed based on: ?Your symptoms. ?Your medical  history. ?A physical exam. ?Tests to find out if your condition is acute or chronic. This may include: ?Checking your nose for nasal polyps. ?Viewing your sinuses using a device that has a light (endoscope). ?Testing for allergies or bacteria. ?Imaging tests, such as an MRI or CT scan. ?In rare cases, a bone biopsy may be done to rule out more serious types of fungal sinus disease. ?How is this  treated? ?Treatment for sinusitis depends on the cause and whether your condition is chronic or acute. ?If caused by a virus, your symptoms should go away on their own within 10 days. You may be given medicines to relieve symptoms. They include: ?Medicines that shrink swollen nasal passages (topical intranasal decongestants). ?Medicines that treat allergies (antihistamines). ?A spray that eases inflammation of the nostrils (topical intranasal corticosteroids). ?Rinses that help get rid of thick mucus in your nose (nasal saline washes). ?If caused by bacteria, your health care provider may recommend waiting to see if your symptoms improve. Most bacterial infections will get better without antibiotic medicine. You may be given antibiotics if you have: ?A severe infection. ?A weak immune system. ?If caused by narrow nasal passages or nasal polyps, you may need to have surgery. ?Follow these instructions at home: ?Medicines ?Take, use, or apply over-the-counter and prescription medicines only as told by your health care provider. These may include nasal sprays. ?If you were prescribed an antibiotic medicine, take it as told by your health care provider. Do not stop taking the antibiotic even if you start to feel better. ?Hydrate and humidify ? ?Drink enough fluid to keep your urine pale yellow. Staying hydrated will help to thin your mucus. ?Use a cool mist humidifier to keep the humidity level in your home above 50%. ?Inhale steam for 10-15 minutes, 3-4 times a day, or as told by your health care provider. You can do this in the bathroom while a hot shower is running. ?Limit your exposure to cool or dry air. ?Rest ?Rest as much as possible. ?Sleep with your head raised (elevated). ?Make sure you get enough sleep each night. ?General instructions ? ?Apply a warm, moist washcloth to your face 3-4 times a day or as told by your health care provider. This will help with discomfort. ?Wash your hands often with soap and  water to reduce your exposure to germs. If soap and water are not available, use hand sanitizer. ?Do not smoke. Avoid being around people who are smoking (secondhand smoke). ?Keep all follow-up visits as told by your health care provider. This is important. ?Contact a health care provider if: ?You have a fever. ?Your symptoms get worse. ?Your symptoms do not improve within 10 days. ?Get help right away if: ?You have a severe headache. ?You have persistent vomiting. ?You have severe pain or swelling around your face or eyes. ?You have vision problems. ?You develop confusion. ?Your neck is stiff. ?You have trouble breathing. ?Summary ?Sinusitis is soreness and inflammation of your sinuses. Sinuses are hollow spaces in the bones around your face. ?This condition is caused by nasal tissues that become inflamed or swollen. The swelling traps or blocks the flow of mucus. This allows bacteria, viruses, and fungi to grow, which leads to infection. ?If you were prescribed an antibiotic medicine, take it as told by your health care provider. Do not stop taking the antibiotic even if you start to feel better. ?Keep all follow-up visits as told by your health care provider. This is important. ?This information is not intended  to replace advice given to you by your health care provider. Make sure you discuss any questions you have with your health care provider. ?Document Revised: 08/05/2017 Document Reviewed: 08/05/2017 ?Elsevier Patient Education ? 2022 Elsevier Inc. ? ? ? ?If you have been instructed to have an in-person evaluation today at a local Urgent Care facility, please use the link below. It will take you to a list of all of our available [facility], including address, phone number and hours of operation. Please do not delay care.  ?[facility] ? ?If you or a family member do not have a primary care provider, use the link below to schedule a visit and establish care. When you choose a [facility] primary care physician or advanced practice provider, you gain a long-term partner in health. ?Find a Primary Care Provider ? ?Learn more about [facility] in-office and virtual care options: ?[facility]

## 2021-06-23 NOTE — Progress Notes (Signed)
?Virtual Visit Consent  ? ?Steven Hudson, you are scheduled for a virtual visit with a [facility] provider today.   ?  ?Just as with appointments in the office, your consent must be obtained to participate.  Your consent will be active for this visit and any virtual visit you may have with one of our providers in the next 365 days.   ?  ?If you have a MyChart account, a copy of this consent can be sent to you electronically.  All virtual visits are billed to your insurance company just like a traditional visit in the office.   ? ?As this is a virtual visit, video technology does not allow for your provider to perform a traditional examination.  This may limit your provider's ability to fully assess your condition.  If your provider identifies any concerns that need to be evaluated in person or the need to arrange testing (such as labs, EKG, etc.), we will make arrangements to do so.   ?  ?Although advances in technology are sophisticated, we cannot ensure that it will always work on either your end or our end.  If the connection with a video visit is poor, the visit may have to be switched to a telephone visit.  With either a video or telephone visit, we are not always able to ensure that we have a secure connection.    ? ?I need to obtain your verbal consent now.   Are you willing to proceed with your visit today?  ?  ?Steven Hudson has provided verbal consent on 06/23/2021 for a virtual visit (video or telephone). ?  ?Mar Daring, PA-C  ? ?Date: 06/23/2021 10:38 AM ? ? ?Virtual Visit via Video Note  ? ?Steven Hudson, connected with  Steven Hudson  ([id number], 02/29/1971) on 06/23/21 at 10:30 AM EDT by a video-enabled telemedicine application and verified that I am speaking with the correct person using two identifiers. ? ?Location: ?Patient: Virtual Visit Location Patient: Home ?Provider: Virtual Visit Location Provider: Home Office ?  ?I discussed the limitations of evaluation and management by  telemedicine and the availability of in person appointments. The patient expressed understanding and agreed to proceed.   ? ?History of Present Illness: ?Steven Hudson is a 51 y.o. who identifies as a male who was assigned male at birth, and is being seen today for possible sinus infection. ? ?HPI: Sinusitis ?This is a new problem. The current episode started 1 to 4 weeks ago (10-11 days ago). The problem has been gradually worsening since onset. There has been no fever. Associated symptoms include chills, congestion, coughing, headaches, sinus pressure and a sore throat. Pertinent negatives include no ear pain or hoarse voice. (Body aches) Treatments tried: Mucinex DM, zyrtec, theraflu. The treatment provided mild relief.   ? ? ?Problems:  ?Patient Active Problem List  ? Diagnosis Date Noted  ? Right leg DVT ([place]) 02/10/2020  ? Obesity, unspecified 05/22/2012  ? Adult ADHD 05/22/2012  ?  ?Allergies: No Known Allergies ?Medications:  ?Current Outpatient Medications:  ?  amoxicillin-clavulanate (AUGMENTIN) 875-125 MG tablet, Take 1 tablet by mouth 2 (two) times daily., Disp: 14 tablet, Rfl: 0 ?  benzonatate (TESSALON) 100 MG capsule, Take 1 capsule (100 mg total) by mouth 3 (three) times daily as needed., Disp: 30 capsule, Rfl: 0 ?  ketoconazole (NIZORAL) 2 % cream, Apply 1 application topically daily., Disp: 30 g, Rfl: 0 ? ?Observations/Objective: ?Patient is well-developed, well-nourished in no acute  distress.  ?Resting comfortably  at home.  ?Head is normocephalic, atraumatic.  ?No labored breathing. ?Speech is clear and coherent with logical content.  ?Patient is alert and oriented at baseline.  ? ? ?Assessment and Plan: ?1. Acute bacterial sinusitis ?- amoxicillin-clavulanate (AUGMENTIN) 875-125 MG tablet; Take 1 tablet by mouth 2 (two) times daily.  Dispense: 14 tablet; Refill: 0 ?- benzonatate (TESSALON) 100 MG capsule; Take 1 capsule (100 mg total) by mouth 3 (three) times daily as needed.  Dispense: 30  capsule; Refill: 0 ? ?- Worsening symptoms that have not responded to OTC medications.  ?- Will give augmentin and Tessalon perles ?- Continue allergy medications.  ?- Stay well hydrated and get plenty of rest.  ?- Seek in person evaluation if no symptom improvement or if symptoms worsen. ? ?Follow Up Instructions: ?I discussed the assessment and treatment plan with the patient. The patient was provided an opportunity to ask questions and all were answered. The patient agreed with the plan and demonstrated an understanding of the instructions.  A copy of instructions were sent to the patient via MyChart unless otherwise noted below.  ? ? ?The patient was advised to call back or seek an in-person evaluation if the symptoms worsen or if the condition fails to improve as anticipated. ? ?Time:  ?I spent 8 minutes with the patient via telehealth technology discussing the above problems/concerns.   ? ?Mar Daring, PA-C ?

## 2022-05-16 ENCOUNTER — Telehealth: Payer: BC Managed Care – PPO | Admitting: Family Medicine

## 2022-05-16 DIAGNOSIS — J069 Acute upper respiratory infection, unspecified: Secondary | ICD-10-CM | POA: Diagnosis not present

## 2022-05-16 DIAGNOSIS — R21 Rash and other nonspecific skin eruption: Secondary | ICD-10-CM

## 2022-05-16 MED ORDER — PSEUDOEPH-BROMPHEN-DM 30-2-10 MG/5ML PO SYRP: 5.0000 mL | ORAL_SOLUTION | Freq: Four times a day (QID) | ORAL | 0 refills | Status: DC | PRN

## 2022-05-16 MED ORDER — FLUCONAZOLE 150 MG PO TABS: ORAL_TABLET | ORAL | 0 refills | Status: DC

## 2022-05-16 MED ORDER — BENZONATATE 100 MG PO CAPS: 100.0000 mg | ORAL_CAPSULE | Freq: Three times a day (TID) | ORAL | 0 refills | Status: DC | PRN

## 2022-05-16 MED ORDER — NYSTATIN-TRIAMCINOLONE 100000-0.1 UNIT/GM-% EX OINT: 1.0000 | TOPICAL_OINTMENT | Freq: Two times a day (BID) | CUTANEOUS | 0 refills | Status: DC

## 2022-05-16 NOTE — Progress Notes (Signed)
Virtual Visit Consent   Steven Hudson, you are scheduled for a virtual visit with a [facility] provider today. Just as with appointments in the office, your consent must be obtained to participate. Your consent will be active for this visit and any virtual visit you may have with one of our providers in the next 365 days. If you have a MyChart account, a copy of this consent can be sent to you electronically.  As this is a virtual visit, video technology does not allow for your provider to perform a traditional examination. This may limit your provider's ability to fully assess your condition. If your provider identifies any concerns that need to be evaluated in person or the need to arrange testing (such as labs, EKG, etc.), we will make arrangements to do so. Although advances in technology are sophisticated, we cannot ensure that it will always work on either your end or our end. If the connection with a video visit is poor, the visit may have to be switched to a telephone visit. With either a video or telephone visit, we are not always able to ensure that we have a secure connection.  By engaging in this virtual visit, you consent to the provision of healthcare and authorize for your insurance to be billed (if applicable) for the services provided during this visit. Depending on your insurance coverage, you may receive a charge related to this service.  I need to obtain your verbal consent now. Are you willing to proceed with your visit today? Steven Hudson has provided verbal consent on 05/16/2022 for a virtual visit (video or telephone). Perlie Mayo, NP  Date: 05/16/2022 3:46 PM  Virtual Visit via Video Note   I, Perlie Mayo, connected with  Steven Hudson  ([id number], 1971/02/10) on 05/16/22 at  3:45 PM EST by a video-enabled telemedicine application and verified that I am speaking with the correct person using two identifiers.  Location: Patient: Virtual Visit Location Patient:  Home Provider: Virtual Visit Location Provider: Home Office   I discussed the limitations of evaluation and management by telemedicine and the availability of in person appointments. The patient expressed understanding and agreed to proceed.    History of Present Illness: Steven Hudson is a 52 y.o. who identifies as a male who was assigned male at birth, and is being seen today for rash under arms-  Onset was several weeks ago rash under arms (had in past but spray helped it) Associated symptoms are redness, itching Modifying factors are spray for fungal (foot spray) Denies fevers chills  Also reports a cough that just started up with some mild congestion. Denies chest pain, SHOB, fevers, chills, other URI symptoms No known sick contacts.  Problems:  Patient Active Problem List   Diagnosis Date Noted   Right leg DVT ([place]) 02/10/2020   Obesity, unspecified 05/22/2012   Adult ADHD 05/22/2012    Allergies: No Known Allergies Medications:  Current Outpatient Medications:    amoxicillin-clavulanate (AUGMENTIN) 875-125 MG tablet, Take 1 tablet by mouth 2 (two) times daily., Disp: 14 tablet, Rfl: 0   benzonatate (TESSALON) 100 MG capsule, Take 1 capsule (100 mg total) by mouth 3 (three) times daily as needed., Disp: 30 capsule, Rfl: 0   ketoconazole (NIZORAL) 2 % cream, Apply 1 application topically daily., Disp: 30 g, Rfl: 0  Observations/Objective: Patient is well-developed, well-nourished in no acute distress.  Resting comfortably  at home.  Head is normocephalic, atraumatic.  No labored breathing.  Speech is clear and coherent with logical content.  Patient is alert and oriented at baseline.    Assessment and Plan:   1. Rash and nonspecific skin eruption  - fluconazole (DIFLUCAN) 150 MG tablet; One tablet weekly for 3 weeks  Dispense: 3 tablet; Refill: 0 - nystatin-triamcinolone ointment (MYCOLOG); Apply 1 Application topically 2 (two) times daily.  Dispense: 30 g; Refill:  0  -treat as above follow up in person if not improved    2. Viral URI with cough  - benzonatate (TESSALON) 100 MG capsule; Take 1 capsule (100 mg total) by mouth 3 (three) times daily as needed for cough.  Dispense: 30 capsule; Refill: 0 - brompheniramine-pseudoephedrine-DM 30-2-10 MG/5ML syrup; Take 5 mLs by mouth 4 (four) times daily as needed.  Dispense: 120 mL; Refill: 0   -Take meds as prescribed -Rest -Use a cool mist humidifier especially during the winter months when heat dries out the air. - Use saline nose sprays frequently to help soothe nasal passages and promote drainage. -Saline irrigations of the nose can be very helpful if done frequently.             * 4X daily for 1 week*             * Use of a nettie pot can be helpful with this.  *Follow directions with this* *Boiled or distilled water only -stay hydrated by drinking plenty of fluids - Keep thermostat turn down low to prevent drying out sinuses - For any cough or congestion- robitussin DM or Delsym as needed - For fever or aches or pains- take tylenol or ibuprofen as directed on bottle             * for fevers greater than 101 orally you may alternate ibuprofen and tylenol every 3 hours.  If you do not improve you will need a follow up visit in person.                Reviewed side effects, risks and benefits of medication.    Patient acknowledged agreement and understanding of the plan.   Past Medical, Surgical, Social History, Allergies, and Medications have been Reviewed.    Follow Up Instructions: I discussed the assessment and treatment plan with the patient. The patient was provided an opportunity to ask questions and all were answered. The patient agreed with the plan and demonstrated an understanding of the instructions.  A copy of instructions were sent to the patient via MyChart unless otherwise noted below.    The patient was advised to call back or seek an in-person evaluation if the symptoms  worsen or if the condition fails to improve as anticipated.  Time:  I spent 10 minutes with the patient via telehealth technology discussing the above problems/concerns.    Perlie Mayo, NP

## 2022-05-16 NOTE — Patient Instructions (Addendum)
Steven Hudson, thank you for joining Perlie Mayo, NP for today's virtual visit.  While this provider is not your primary care provider (PCP), if your PCP is located in our provider database this encounter information will be shared with them immediately following your visit.   [facility] account gives you access to today's visit and all your visits, tests, and labs performed at [facility] " click here if you don't have a [facility] account or go to [email or web address]  Consent: (Patient) Steven Hudson provided verbal consent for this virtual visit at the beginning of the encounter.  Current Medications:  Current Outpatient Medications:    benzonatate (TESSALON) 100 MG capsule, Take 1 capsule (100 mg total) by mouth 3 (three) times daily as needed for cough., Disp: 30 capsule, Rfl: 0   brompheniramine-pseudoephedrine-DM 30-2-10 MG/5ML syrup, Take 5 mLs by mouth 4 (four) times daily as needed., Disp: 120 mL, Rfl: 0   fluconazole (DIFLUCAN) 150 MG tablet, One tablet weekly for 3 weeks, Disp: 3 tablet, Rfl: 0   nystatin-triamcinolone ointment (MYCOLOG), Apply 1 Application topically 2 (two) times daily., Disp: 30 g, Rfl: 0   amoxicillin-clavulanate (AUGMENTIN) 875-125 MG tablet, Take 1 tablet by mouth 2 (two) times daily., Disp: 14 tablet, Rfl: 0   ketoconazole (NIZORAL) 2 % cream, Apply 1 application topically daily., Disp: 30 g, Rfl: 0   Medications ordered in this encounter:  Meds ordered this encounter  Medications   fluconazole (DIFLUCAN) 150 MG tablet    Sig: One tablet weekly for 3 weeks    Dispense:  3 tablet    Refill:  0    Order Specific Question:   Supervising Provider    Answer:   Chase Picket [id number]   nystatin-triamcinolone ointment (MYCOLOG)    Sig: Apply 1 Application topically 2 (two) times daily.    Dispense:  30 g    Refill:  0    Order Specific Question:   Supervising Provider    Answer:   Chase Picket [id number]    benzonatate (TESSALON) 100 MG capsule    Sig: Take 1 capsule (100 mg total) by mouth 3 (three) times daily as needed for cough.    Dispense:  30 capsule    Refill:  0    Order Specific Question:   Supervising Provider    Answer:   Chase Picket [id number]   brompheniramine-pseudoephedrine-DM 30-2-10 MG/5ML syrup    Sig: Take 5 mLs by mouth 4 (four) times daily as needed.    Dispense:  120 mL    Refill:  0    Order Specific Question:   Supervising Provider    Answer:   Chase Picket [id number]     *If you need refills on other medications prior to your next appointment, please contact your pharmacy*  Follow-Up: Call back or seek an in-person evaluation if the symptoms worsen or if the condition fails to improve as anticipated.  [facility] [phone]  Other Instructions  Follow up in person if rash is not improved    -Take meds as prescribed -Rest -Use a cool mist humidifier especially during the winter months when heat dries out the air. - Use saline nose sprays frequently to help soothe nasal passages and promote drainage. -Saline irrigations of the nose can be very helpful if done frequently.             * 4X daily for 1 week*             *  Use of a nettie pot can be helpful with this.  *Follow directions with this* *Boiled or distilled water only -stay hydrated by drinking plenty of fluids - Keep thermostat turn down low to prevent drying out sinuses - For any cough or congestion- robitussin DM or Delsym as needed - For fever or aches or pains- take tylenol or ibuprofen as directed on bottle             * for fevers greater than 101 orally you may alternate ibuprofen and tylenol every 3 hours.  If you do not improve you will need a follow up visit in person.                  If you have been instructed to have an in-person evaluation today at a local Urgent Care facility, please use the link below. It will take you to a list of all of our  available [facility], including address, phone number and hours of operation. Please do not delay care.  [facility]  If you or a family member do not have a primary care provider, use the link below to schedule a visit and establish care. When you choose a [facility] primary care physician or advanced practice provider, you gain a long-term partner in health. Find a Primary Care Provider  Learn more about 's in-office and virtual care options: [facility]

## 2022-10-21 ENCOUNTER — Emergency Department (HOSPITAL_BASED_OUTPATIENT_CLINIC_OR_DEPARTMENT_OTHER)
Admission: EM | Admit: 2022-10-21 | Discharge: 2022-10-21 | Disposition: A | Discharge status: Home / Self Care | Payer: BC Managed Care – PPO | Attending: Emergency Medicine | Admitting: Emergency Medicine

## 2022-10-21 ENCOUNTER — Other Ambulatory Visit: Payer: Self-pay

## 2022-10-21 ENCOUNTER — Encounter (HOSPITAL_BASED_OUTPATIENT_CLINIC_OR_DEPARTMENT_OTHER): Payer: Self-pay | Admitting: Emergency Medicine

## 2022-10-21 DIAGNOSIS — Z87891 Personal history of nicotine dependence: Secondary | ICD-10-CM | POA: Diagnosis not present

## 2022-10-21 DIAGNOSIS — H5711 Ocular pain, right eye: Secondary | ICD-10-CM | POA: Insufficient documentation

## 2022-10-21 MED ORDER — FLUORESCEIN SODIUM 1 MG OP STRP
1.0000 | ORAL_STRIP | Freq: Once | OPHTHALMIC | Status: AC
  Administered 2022-10-21: 1 via OPHTHALMIC
  Filled 2022-10-21: qty 1

## 2022-10-21 MED ORDER — TETRACAINE HCL 0.5 % OP SOLN
2.0000 [drp] | Freq: Once | OPHTHALMIC | Status: AC
  Administered 2022-10-21: 2 [drp] via OPHTHALMIC
  Filled 2022-10-21: qty 4

## 2022-10-21 NOTE — Discharge Instructions (Signed)
As discussed, I have talked to the ophthalmologist Dr. Sherryll Burger he wants to see you in his clinic between 8 and noon tomorrow morning for further assessment.  I would call their office as soon as the open to let them know you are coming but he is expecting you.  Please do not hesitate to return to emergency department if the worrisome signs and symptoms we discussed become apparent.

## 2022-10-21 NOTE — ED Provider Notes (Signed)
[place] EMERGENCY DEPARTMENT AT [facility] Provider Note   CSN: [id number] Arrival date & time: 10/21/22  1717     History  Chief Complaint  Patient presents with   Eye Problem    Steven Hudson is a 52 y.o. male.   Eye Problem   52 year old male presents emergency department with complaints of right eye pain.  Patient states that he noticed right eye pain upon awakening yesterday morning.  States that he is having sensitivity to light on his right side but denies any visual changes such as blurred vision/double vision.  Denies any contact lens or glasses use.  Denies any trauma to eye.  Denies any foreign body sensation in his right eye.  Does state that he tried to patch his right eye but noticed pain when looking at bright light in his right eye when he was looking with just his left eye.  Denies history of similar symptoms in the past.  Does report distant ocular surgery when he had a metal wire embedded in his right eye of which was removed in 1997 otherwise, without any known ocular history.  Past medical history significant for DVT, obesity, ADHD  Home Medications Prior to Admission medications   Medication Sig Start Date End Date Taking? Authorizing Provider  amoxicillin-clavulanate (AUGMENTIN) 875-125 MG tablet Take 1 tablet by mouth 2 (two) times daily. 06/23/21   Margaretann Loveless, PA-C  benzonatate (TESSALON) 100 MG capsule Take 1 capsule (100 mg total) by mouth 3 (three) times daily as needed for cough. 05/16/22   Freddy Finner, NP  brompheniramine-pseudoephedrine-DM 30-2-10 MG/5ML syrup Take 5 mLs by mouth 4 (four) times daily as needed. 05/16/22   Freddy Finner, NP  fluconazole (DIFLUCAN) 150 MG tablet One tablet weekly for 3 weeks 05/16/22   Freddy Finner, NP  ketoconazole (NIZORAL) 2 % cream Apply 1 application topically daily. 10/07/20   Deeann Saint, MD  nystatin-triamcinolone ointment [facility]) Apply 1 Application topically 2 (two) times daily.  05/16/22   Freddy Finner, NP      Allergies    Patient has no known allergies.    Review of Systems   Review of Systems  All other systems reviewed and are negative.   Physical Exam Updated Vital Signs BP 110/79   Pulse 62   Temp 98.3 F (36.8 C) (Oral)   Resp 18   SpO2 98%  Physical Exam Vitals and nursing note reviewed.  Constitutional:      General: He is not in acute distress.    Appearance: He is well-developed.  HENT:     Head: Normocephalic and atraumatic.  Eyes:     Intraocular pressure: Right eye pressure is 15 mmHg. Measurements were taken using a handheld tonometer.    Extraocular Movements:     Right eye: No nystagmus.     Left eye: No nystagmus.     Comments: Patient with some scleral injection without sparing of the limbus.  PERRLA bilaterally but with pain elicited in right eye with pupillary reflex tested in both right and left eye.  Slight discomfort with EOMs of the right eye but able to perform.  Eye not proptotic in appearance without swelling, erythema, palpable fluctuance of surrounding eyelid or skin.  Fluorescein exam showed no uptake.  No evidence of ulceration, abrasion, foreign body.  Seidel sign negative.  Patient noted very minimal improvement of symptoms with administration of topical tetracaine.  Patient's visual acuity 20/20 OD OS OU  Cardiovascular:  Rate and Rhythm: Normal rate and regular rhythm.     Heart sounds: No murmur heard. Pulmonary:     Effort: Pulmonary effort is normal. No respiratory distress.     Breath sounds: Normal breath sounds.  Abdominal:     Palpations: Abdomen is soft.     Tenderness: There is no abdominal tenderness.  Musculoskeletal:        General: No swelling.     Cervical back: Neck supple.  Skin:    General: Skin is warm and dry.     Capillary Refill: Capillary refill takes less than 2 seconds.  Neurological:     Mental Status: He is alert.  Psychiatric:        Mood and Affect: Mood normal.      ED Results / Procedures / Treatments   Labs (all labs ordered are listed, but only abnormal results are displayed) Labs Reviewed - No data to display  EKG None  Radiology No results found.  Procedures Procedures    Medications Ordered in ED Medications  tetracaine (PONTOCAINE) 0.5 % ophthalmic solution 2 drop (2 drops Right Eye Given 10/21/22 1805)  fluorescein ophthalmic strip 1 strip (1 strip Right Eye Given 10/21/22 1805)    ED Course/ Medical Decision Making/ A&P Clinical Course as of 10/21/22 1846  Sun Oct 21, 2022  1610 Consulted Dr. Sherryll Burger of ophthalmology who recommended follow-up tomorrow in his clinic between 8 AM and noon.  Advised against steroid drops at this time. [CR]    Clinical Course User Index [CR] Peter Garter, PA                                 Medical Decision Making Risk Prescription drug management.   This patient presents to the ED for concern of eye pain, this involves an extensive number of treatment options, and is a complaint that carries with it a high risk of complications and morbidity.  The differential diagnosis includes glaucoma, uveitis, globe perforation, corneal abrasion, corneal ulceration, foreign body retention, history of vitreous hemorrhage, optic neuritis   Co morbidities that complicate the patient evaluation  See HPI   Additional history obtained:  Additional history obtained from EMR External records from outside source obtained and reviewed including hospital records   Lab Tests:  N/a   Imaging Studies ordered:  N/a   Cardiac Monitoring: / EKG:  The patient was maintained on a cardiac monitor.  I personally viewed and interpreted the cardiac monitored which showed an underlying rhythm of: Sinus rhythm   Consultations Obtained:  See ED course  Problem List / ED Course / Critical interventions / Medication management  Right eye pain I ordered medication including tetracaine, fluorescein    Reevaluation of the patient after these medicines showed that the patient stayed the same I have reviewed the patients home medicines and have made adjustments as needed   Social Determinants of Health:  Former tobacco chew use but denies illicit drug use.   Test / Admission - Considered:  Right eye pain Vitals signs within normal range and stable throughout visit. 52 year old male presents emergency department with complaints of eye pain beginning yesterday morning.  On exam, patient with scleral injection, pain with pupillary reflex in the right eye ipsilaterally as well as when tested contralaterally as well as some slight discomfort with EOMs in the right eye.  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  Low suspicion for acute  angle-closure glaucoma given normal intraocular pressure.  No evidence of fluorescein uptake concerning for corneal abrasion/ulceration/foreign body or evidence of globe perforation.  Patient without trauma to the eye concerning for posterior vitreous hemorrhage, hyphema or other intraocular injury.  Patient without any visual acuity deficits on exam.  Consulted ophthalmology regarding the patient who recommended follow-up tomorrow morning for further assessment.  Treatment plan discussed at length with patient and he acknowledged understanding was agreeable to said plan.  Patient overall well-appearing, afebrile in no acute distress. Worrisome signs and symptoms were discussed with the patient, and the patient acknowledged understanding to return to the ED if noticed. Patient was stable upon discharge.          Final Clinical Impression(s) / ED Diagnoses Final diagnoses:  Pain of right eye    Rx / DC Orders ED Discharge Orders     None         Peter Garter, [place] 10/21/22 1846    Benjiman Core, MD 10/21/22 2302

## 2022-10-21 NOTE — ED Triage Notes (Signed)
Right eye sharp pain with bright light (sun), some pressure, redness to eye Started yesterday. Denies blurriness, loss of vision Denies known new injury, or Foreign body

## 2022-10-22 DIAGNOSIS — H16141 Punctate keratitis, right eye: Secondary | ICD-10-CM | POA: Diagnosis not present

## 2022-10-23 ENCOUNTER — Telehealth: Payer: BC Managed Care – PPO | Admitting: Family Medicine

## 2022-10-23 DIAGNOSIS — U071 COVID-19: Secondary | ICD-10-CM | POA: Diagnosis not present

## 2022-10-23 MED ORDER — MOLNUPIRAVIR EUA 200MG CAPSULE
4.0000 | ORAL_CAPSULE | Freq: Two times a day (BID) | ORAL | 0 refills | Status: AC
Start: 2022-10-23 — End: 2022-10-28

## 2022-10-23 NOTE — Patient Instructions (Signed)
Steven Hudson, thank you for joining Freddy Finner, NP for today's virtual visit.  While this provider is not your primary care provider (PCP), if your PCP is located in our provider database this encounter information will be shared with them immediately following your visit.   A [facility] MyChart account gives you access to today's visit and all your visits, tests, and labs performed at [facility] " click here if you don't have a [facility] MyChart account or go to [email or web address]  Consent: (Patient) Steven Hudson provided verbal consent for this virtual visit at the beginning of the encounter.  Current Medications:  Current Outpatient Medications:    molnupiravir EUA (LAGEVRIO) 200 mg CAPS capsule, Take 4 capsules (800 mg total) by mouth 2 (two) times daily for 5 days., Disp: 40 capsule, Rfl: 0   amoxicillin-clavulanate (AUGMENTIN) 875-125 MG tablet, Take 1 tablet by mouth 2 (two) times daily., Disp: 14 tablet, Rfl: 0   benzonatate (TESSALON) 100 MG capsule, Take 1 capsule (100 mg total) by mouth 3 (three) times daily as needed for cough., Disp: 30 capsule, Rfl: 0   brompheniramine-pseudoephedrine-DM 30-2-10 MG/5ML syrup, Take 5 mLs by mouth 4 (four) times daily as needed., Disp: 120 mL, Rfl: 0   fluconazole (DIFLUCAN) 150 MG tablet, One tablet weekly for 3 weeks, Disp: 3 tablet, Rfl: 0   ketoconazole (NIZORAL) 2 % cream, Apply 1 application topically daily., Disp: 30 g, Rfl: 0   nystatin-triamcinolone ointment (MYCOLOG), Apply 1 Application topically 2 (two) times daily., Disp: 30 g, Rfl: 0   Medications ordered in this encounter:  Meds ordered this encounter  Medications   molnupiravir EUA (LAGEVRIO) 200 mg CAPS capsule    Sig: Take 4 capsules (800 mg total) by mouth 2 (two) times daily for 5 days.    Dispense:  40 capsule    Refill:  0    Order Specific Question:   Supervising Provider    Answer:   Merrilee Jansky [id number]     *If you need refills on  other medications prior to your next appointment, please contact your pharmacy*  Follow-Up: Call back or seek an in-person evaluation if the symptoms worsen or if the condition fails to improve as anticipated.  [facility] [phone]  Care Instructions:  - Continue OTC symptomatic management of choice  - Take prescribed medications as directed - Push fluids - Rest as needed - Discussed return precautions and when to seek in-person evaluation, sent via AVS as well    Isolation Instructions: You are to isolate at home until you have been fever free for at least 24 hours without a fever-reducing medication, and symptoms have been steadily improving for 24 hours. At that time,  you can end isolation but need to mask for an additional 5 days.   If you must be around other household members who do not have symptoms, you need to make sure that both you and the family members are masking consistently with a high-quality mask.  If you note any worsening of symptoms despite treatment, please seek an in-person evaluation ASAP. If you note any significant shortness of breath or any chest pain, please seek ER evaluation. Please do not delay care!   COVID-19: What to Do if You Are Sick If you test positive and are an older adult or someone who is at high risk of getting very sick from COVID-19, treatment may be available. Contact a healthcare provider right away after a positive  test to determine if you are eligible, even if your symptoms are mild right now. You can also visit a Test to Treat location and, if eligible, receive a prescription from a provider. Don't delay: Treatment must be started within the first few days to be effective. If you have a fever, cough, or other symptoms, you might have COVID-19. Most people have mild illness and are able to recover at home. If you are sick: Keep track of your symptoms. If you have an emergency warning sign (including trouble breathing),  call 911. Steps to help prevent the spread of COVID-19 if you are sick If you are sick with COVID-19 or think you might have COVID-19, follow the steps below to care for yourself and to help protect other people in your home and community. Stay home except to get medical care Stay home. Most people with COVID-19 have mild illness and can recover at home without medical care. Do not leave your home, except to get medical care. Do not visit public areas and do not go to places where you are unable to wear a mask. Take care of yourself. Get rest and stay hydrated. Take over-the-counter medicines, such as acetaminophen, to help you feel better. Stay in touch with your doctor. Call before you get medical care. Be sure to get care if you have trouble breathing, or have any other emergency warning signs, or if you think it is an emergency. Avoid public transportation, ride-sharing, or taxis if possible. Get tested If you have symptoms of COVID-19, get tested. While waiting for test results, stay away from others, including staying apart from those living in your household. Get tested as soon as possible after your symptoms start. Treatments may be available for people with COVID-19 who are at risk for becoming very sick. Don't delay: Treatment must be started early to be effective--some treatments must begin within 5 days of your first symptoms. Contact your healthcare provider right away if your test result is positive to determine if you are eligible. Self-tests are one of several options for testing for the virus that causes COVID-19 and may be more convenient than laboratory-based tests and point-of-care tests. Ask your healthcare provider or your local health department if you need help interpreting your test results. You can visit your state, tribal, local, and territorial health department's website to look for the latest local information on testing sites. Separate yourself from other people As much  as possible, stay in a specific room and away from other people and pets in your home. If possible, you should use a separate bathroom. If you need to be around other people or animals in or outside of the home, wear a well-fitting mask. Tell your close contacts that they may have been exposed to COVID-19. An infected person can spread COVID-19 starting 48 hours (or 2 days) before the person has any symptoms or tests positive. By letting your close contacts know they may have been exposed to COVID-19, you are helping to protect everyone. See COVID-19 and Animals if you have questions about pets. If you are diagnosed with COVID-19, someone from the health department may call you. Answer the call to slow the spread. Monitor your symptoms Symptoms of COVID-19 include fever, cough, or other symptoms. Follow care instructions from your healthcare provider and local health department. Your local health authorities may give instructions on checking your symptoms and reporting information. When to seek emergency medical attention Look for emergency warning signs* for COVID-19. If someone is  showing any of these signs, seek emergency medical care immediately: Trouble breathing Persistent pain or pressure in the chest New confusion Inability to wake or stay awake Pale, gray, or blue-colored skin, lips, or nail beds, depending on skin tone *This list is not all possible symptoms. Please call your medical provider for any other symptoms that are severe or concerning to you. Call 911 or call ahead to your local emergency facility: Notify the operator that you are seeking care for someone who has or may have COVID-19. Call ahead before visiting your doctor Call ahead. Many medical visits for routine care are being postponed or done by phone or telemedicine. If you have a medical appointment that cannot be postponed, call your doctor's office, and tell them you have or may have COVID-19. This will help the office  protect themselves and other patients. If you are sick, wear a well-fitting mask You should wear a mask if you must be around other people or animals, including pets (even at home). Wear a mask with the best fit, protection, and comfort for you. You don't need to wear the mask if you are alone. If you can't put on a mask (because of trouble breathing, for example), cover your coughs and sneezes in some other way. Try to stay at least 6 feet away from other people. This will help protect the people around you. Masks should not be placed on young children under age 21 years, anyone who has trouble breathing, or anyone who is not able to remove the mask without help. Cover your coughs and sneezes Cover your mouth and nose with a tissue when you cough or sneeze. Throw away used tissues in a lined trash can. Immediately wash your hands with soap and water for at least 20 seconds. If soap and water are not available, clean your hands with an alcohol-based hand sanitizer that contains at least 60% alcohol. Clean your hands often Wash your hands often with soap and water for at least 20 seconds. This is especially important after blowing your nose, coughing, or sneezing; going to the bathroom; and before eating or preparing food. Use hand sanitizer if soap and water are not available. Use an alcohol-based hand sanitizer with at least 60% alcohol, covering all surfaces of your hands and rubbing them together until they feel dry. Soap and water are the best option, especially if hands are visibly dirty. Avoid touching your eyes, nose, and mouth with unwashed hands. Handwashing Tips Avoid sharing personal household items Do not share dishes, drinking glasses, cups, eating utensils, towels, or bedding with other people in your home. Wash these items thoroughly after using them with soap and water or put in the dishwasher. Clean surfaces in your home regularly Clean and disinfect high-touch surfaces (for  example, doorknobs, tables, handles, light switches, and countertops) in your "sick room" and bathroom. In shared spaces, you should clean and disinfect surfaces and items after each use by the person who is ill. If you are sick and cannot clean, a caregiver or other person should only clean and disinfect the area around you (such as your bedroom and bathroom) on an as needed basis. Your caregiver/other person should wait as long as possible (at least several hours) and wear a mask before entering, cleaning, and disinfecting shared spaces that you use. Clean and disinfect areas that may have blood, stool, or body fluids on them. Use household cleaners and disinfectants. Clean visible dirty surfaces with household cleaners containing soap or detergent. Then,  use a household disinfectant. Use a product from [employer] List N: Disinfectants for Coronavirus (COVID-19). Be sure to follow the instructions on the label to ensure safe and effective use of the product. Many products recommend keeping the surface wet with a disinfectant for a certain period of time (look at "contact time" on the product label). You may also need to wear personal protective equipment, such as gloves, depending on the directions on the product label. Immediately after disinfecting, wash your hands with soap and water for 20 seconds. For completed guidance on cleaning and disinfecting your home, visit Complete Disinfection Guidance. Take steps to improve ventilation at home Improve ventilation (air flow) at home to help prevent from spreading COVID-19 to other people in your household. Clear out COVID-19 virus particles in the air by opening windows, using air filters, and turning on fans in your home. Use this interactive tool to learn how to improve air flow in your home. When you can be around others after being sick with COVID-19 Deciding when you can be around others is different for different situations. Find out when you can  safely end home isolation. For any additional questions about your care, contact your healthcare provider or state or local health department. 06/07/2020 Content source: [facility] ([facility]), Division of Viral Diseases This information is not intended to replace advice given to you by your health care provider. Make sure you discuss any questions you have with your health care provider. Document Revised: 07/21/2020 Document Reviewed: 07/21/2020 Elsevier Patient Education  2022 [employer].     If you have been instructed to have an in-person evaluation today at a local Urgent Care facility, please use the link below. It will take you to a list of all of our available [facility], including address, phone number and hours of operation. Please do not delay care.  [facility]  If you or a family member do not have a primary care provider, use the link below to schedule a visit and establish care. When you choose a [facility] primary care physician or advanced practice provider, you gain a long-term partner in health. Find a Primary Care Provider  Learn more about [facility]'s in-office and virtual care options:  - Get Care Now

## 2022-10-23 NOTE — Progress Notes (Signed)
Virtual Visit Consent   Steven Hudson, you are scheduled for a virtual visit with a [facility] provider today. Just as with appointments in the office, your consent must be obtained to participate. Your consent will be active for this visit and any virtual visit you may have with one of our providers in the next 365 days. If you have a MyChart account, a copy of this consent can be sent to you electronically.  As this is a virtual visit, video technology does not allow for your provider to perform a traditional examination. This may limit your provider's ability to fully assess your condition. If your provider identifies any concerns that need to be evaluated in person or the need to arrange testing (such as labs, EKG, etc.), we will make arrangements to do so. Although advances in technology are sophisticated, we cannot ensure that it will always work on either your end or our end. If the connection with a video visit is poor, the visit may have to be switched to a telephone visit. With either a video or telephone visit, we are not always able to ensure that we have a secure connection.  By engaging in this virtual visit, you consent to the provision of healthcare and authorize for your insurance to be billed (if applicable) for the services provided during this visit. Depending on your insurance coverage, you may receive a charge related to this service.  I need to obtain your verbal consent now. Are you willing to proceed with your visit today? Steven Hudson has provided verbal consent on 10/23/2022 for a virtual visit (video or telephone). Steven Finner, NP  Date: 10/23/2022 3:13 PM  Virtual Visit via Video Note   I, Steven Hudson, connected with  Steven Hudson  ([id number], [id number]) on 10/23/22 at  3:15 PM EDT by a video-enabled telemedicine application and verified that I am speaking with the correct person using two identifiers.  Location: Patient: Virtual Visit Location Patient:  Home Provider: Virtual Visit Location Provider: Home Office   I discussed the limitations of evaluation and management by telemedicine and the availability of in person appointments. The patient expressed understanding and agreed to proceed.    History of Present Illness: Steven Hudson is a 52 y.o. who identifies as a male who was assigned male at birth, and is being seen today for COVID +  Onset was tired and run down- stuffy last night- and woke up more stuffiness so took a test this morning.  Associated symptoms are none outside above Modifying factors are OTC as needed  Denies chest pain, shortness of breath, fevers, chills  Has had COVID before Exposure to sick contacts- unknown COVID test: + Vaccines: first set    Problems:  Patient Active Problem List   Diagnosis Date Noted   Right leg DVT (HCC) 02/10/2020   Obesity, unspecified 05/22/2012   Adult ADHD 05/22/2012    Allergies: No Known Allergies Medications:  Current Outpatient Medications:    amoxicillin-clavulanate (AUGMENTIN) 875-125 MG tablet, Take 1 tablet by mouth 2 (two) times daily., Disp: 14 tablet, Rfl: 0   benzonatate (TESSALON) 100 MG capsule, Take 1 capsule (100 mg total) by mouth 3 (three) times daily as needed for cough., Disp: 30 capsule, Rfl: 0   brompheniramine-pseudoephedrine-DM 30-2-10 MG/5ML syrup, Take 5 mLs by mouth 4 (four) times daily as needed., Disp: 120 mL, Rfl: 0   fluconazole (DIFLUCAN) 150 MG tablet, One tablet weekly for 3 weeks, Disp: 3 tablet,  Rfl: 0   ketoconazole (NIZORAL) 2 % cream, Apply 1 application topically daily., Disp: 30 g, Rfl: 0   nystatin-triamcinolone ointment (MYCOLOG), Apply 1 Application topically 2 (two) times daily., Disp: 30 g, Rfl: 0  Observations/Objective: Patient is well-developed, well-nourished in no acute distress.  Resting comfortably  at home.  Head is normocephalic, atraumatic.  No labored breathing.  Speech is clear and coherent with logical content.   Patient is alert and oriented at baseline.    Assessment and Plan:  1. COVID-19  - molnupiravir EUA (LAGEVRIO) 200 mg CAPS capsule; Take 4 capsules (800 mg total) by mouth 2 (two) times daily for 5 days.  Dispense: 40 capsule; Refill: 0  -advised on medication and use -encouraged OTC for symptoms  -strict in person follow up if not improving or worsening  -info on AVS   Reviewed side effects, risks and benefits of medication.    Patient acknowledged agreement and understanding of the plan.   Past Medical, Surgical, Social History, Allergies, and Medications have been Reviewed.    Follow Up Instructions: I discussed the assessment and treatment plan with the patient. The patient was provided an opportunity to ask questions and all were answered. The patient agreed with the plan and demonstrated an understanding of the instructions.  A copy of instructions were sent to the patient via MyChart unless otherwise noted below.    The patient was advised to call back or seek an in-person evaluation if the symptoms worsen or if the condition fails to improve as anticipated.  Time:  I spent 14 minutes with the patient via telehealth technology discussing the above problems/concerns.    Steven Finner, NP

## 2023-01-03 ENCOUNTER — Encounter: Payer: Self-pay | Admitting: Internal Medicine

## 2023-01-04 ENCOUNTER — Encounter: Payer: Self-pay | Admitting: Internal Medicine

## 2023-01-23 DIAGNOSIS — L821 Other seborrheic keratosis: Secondary | ICD-10-CM | POA: Insufficient documentation

## 2023-01-23 DIAGNOSIS — L814 Other melanin hyperpigmentation: Secondary | ICD-10-CM | POA: Insufficient documentation

## 2023-01-23 DIAGNOSIS — D225 Melanocytic nevi of trunk: Secondary | ICD-10-CM | POA: Insufficient documentation

## 2023-01-29 ENCOUNTER — Telehealth: Payer: Self-pay

## 2023-01-29 NOTE — Telephone Encounter (Signed)
Multiple attempts made to reach patient- NO answer-message left for patient to call back to the office to reschedule PV appt- if patient fails to call back to the office prior to end of business day ---PV and procedure appts will be cancelled and a no show letter will be sent to the patient;

## 2023-01-30 ENCOUNTER — Ambulatory Visit (AMBULATORY_SURGERY_CENTER): Payer: BC Managed Care – PPO

## 2023-01-30 VITALS — Ht 72.0 in | Wt 275.0 lb

## 2023-01-30 DIAGNOSIS — Z1211 Encounter for screening for malignant neoplasm of colon: Secondary | ICD-10-CM

## 2023-01-30 NOTE — Progress Notes (Signed)

## 2023-01-31 ENCOUNTER — Encounter: Payer: Self-pay | Admitting: Internal Medicine

## 2023-02-12 ENCOUNTER — Encounter: Payer: BC Managed Care – PPO | Admitting: Internal Medicine

## 2023-02-25 NOTE — Progress Notes (Unsigned)
[name] History and Physical   Primary Care Physician:  Shade Flood, MD   Reason for Procedure:   CRCA screening  Plan:    colonoscopy     HPI: Steven Hudson is a 52 y.o. male presenting for a screening colonoscopy.   Past Medical History:  Diagnosis Date   Clotting disorder [facility])     Past Surgical History:  Procedure Laterality Date   EYE SURGERY  1996    Prior to Admission medications   Medication Sig Start Date End Date Taking? Authorizing Provider  amoxicillin-clavulanate (AUGMENTIN) 875-125 MG tablet Take 1 tablet by mouth 2 (two) times daily. 06/23/21   Margaretann Loveless, PA-C  ketoconazole (NIZORAL) 2 % cream Apply 1 application topically daily. 10/07/20   Deeann Saint, MD    No current outpatient medications on file.   No current facility-administered medications for this visit.    Allergies as of 02/26/2023   (No Known Allergies)    Family History  Problem Relation Age of Onset   Hypertension Mother    Leukemia Mother    Cancer Mother    Diabetes Father    Hypertension Brother    Rectal cancer Paternal Uncle    Heart disease Maternal Grandmother    Heart disease Maternal Grandfather    Diabetes Paternal Grandfather    Colon polyps Neg Hx    Colon cancer Neg Hx    Esophageal cancer Neg Hx    Stomach cancer Neg Hx     Social History   Socioeconomic History   Marital status: Married    Spouse name: Not on file   Number of children: Not on file   Years of education: Not on file   Highest education level: Not on file  Occupational History   Not on file  Tobacco Use   Smoking status: Never   Smokeless tobacco: Former    Types: Chew  Substance and Sexual Activity   Alcohol use: Yes   Drug use: No   Sexual activity: Yes  Other Topics Concern   Not on file  Social History Narrative   Not on file   Social Determinants of Health   Financial Resource Strain: Not on file  Food Insecurity: Not on file   Transportation Needs: Not on file  Physical Activity: Not on file  Stress: Not on file  Social Connections: Not on file  Intimate Partner Violence: Not on file    Review of Systems: Positive for *** All other review of systems negative except as mentioned in the HPI.  Physical Exam: Vital signs There were no vitals taken for this visit.  General:   Alert,  Well-developed, well-nourished, pleasant and cooperative in NAD Lungs:  Clear throughout to auscultation.   Heart:  Regular rate and rhythm; no murmurs, clicks, rubs,  or gallops. Abdomen:  Soft, nontender and nondistended. Normal bowel sounds.   Neuro/Psych:  Alert and cooperative. Normal mood and affect. A and O x 3   @Burnice Vassel  Sena Slate, MD, [facility] [phone] (pager) 02/25/2023 8:48 PM@

## 2023-02-26 ENCOUNTER — Ambulatory Visit: Payer: BC Managed Care – PPO | Admitting: Internal Medicine

## 2023-02-26 ENCOUNTER — Encounter: Payer: Self-pay | Admitting: Internal Medicine

## 2023-02-26 VITALS — BP 114/78 | HR 62 | Temp 97.2°F | Resp 16 | Ht 72.0 in | Wt 275.0 lb

## 2023-02-26 DIAGNOSIS — Z1211 Encounter for screening for malignant neoplasm of colon: Secondary | ICD-10-CM

## 2023-02-26 MED ORDER — SODIUM CHLORIDE 0.9 % IV SOLN: 500.0000 mL | Freq: Once | INTRAVENOUS | Status: DC

## 2023-02-26 NOTE — Progress Notes (Signed)
Report to PACU, RN, vss, BBS= Clear.  

## 2023-02-26 NOTE — Op Note (Signed)
[facility] Patient Name: Steven Hudson Procedure Date: 02/26/2023 8:11 AM MRN: [id number] Endoscopist: Iva Boop , MD, [id number] Age: 52 Referring MD:  Date of Birth: 1971-01-25 Gender: Male Account #: [id number] Procedure:                Colonoscopy Indications:              Screening for colorectal malignant neoplasm, This                            is the patient's first colonoscopy Medicines:                Monitored Anesthesia Care Procedure:                Pre-Anesthesia Assessment:                           - Prior to the procedure, a History and Physical                            was performed, and patient medications and                            allergies were reviewed. The patient's tolerance of                            previous anesthesia was also reviewed. The risks                            and benefits of the procedure and the sedation                            options and risks were discussed with the patient.                            All questions were answered, and informed consent                            was obtained. Prior Anticoagulants: The patient has                            taken no anticoagulant or antiplatelet agents. ASA                            Grade Assessment: II - A patient with mild systemic                            disease. After reviewing the risks and benefits,                            the patient was deemed in satisfactory condition to                            undergo the procedure.  After obtaining informed consent, the colonoscope                            was passed under direct vision. Throughout the                            procedure, the patient's blood pressure, pulse, and                            oxygen saturations were monitored continuously. The                            Olympus Scope SN: [id number] was introduced through                            the anus and advanced to  the the cecum, identified                            by appendiceal orifice and ileocecal valve. The                            colonoscopy was performed without difficulty. The                            patient tolerated the procedure well. The quality                            of the bowel preparation was good. The ileocecal                            valve, appendiceal orifice, and rectum were                            photographed. The bowel preparation used was                            Miralax via split dose instruction. Scope In: 8:15:07 AM Scope Out: 8:27:36 AM Scope Withdrawal Time: 0 hours 10 minutes 42 seconds  Total Procedure Duration: 0 hours 12 minutes 29 seconds  Findings:                 The perianal and digital rectal examinations were                            normal. Pertinent negatives include normal prostate                            (size, shape, and consistency).                           The entire examined colon appeared normal on direct                            and retroflexion views. Complications:  No immediate complications. Estimated Blood Loss:     Estimated blood loss: none. Impression:               - The entire examined colon is normal on direct and                            retroflexion views.                           - No specimens collected. Recommendation:           - Patient has a contact number available for                            emergencies. The signs and symptoms of potential                            delayed complications were discussed with the                            patient. Return to normal activities tomorrow.                            Written discharge instructions were provided to the                            patient.                           - Resume previous diet.                           - Continue present medications.                           - Repeat colonoscopy in 10 years for screening                             purposes. Iva Boop, MD 02/26/2023 8:34:13 AM This report has been signed electronically.

## 2023-02-26 NOTE — Progress Notes (Signed)
Pt's states no medical or surgical changes since previsit or office visit. 

## 2023-02-26 NOTE — Patient Instructions (Addendum)
Please read handouts provided. Continue present medications. Repeat colonoscopy in 10 years for screening.   YOU HAD AN ENDOSCOPIC PROCEDURE TODAY AT THE [facility]:   Refer to the procedure report that was given to you for any specific questions about what was found during the examination.  If the procedure report does not answer your questions, please call your gastroenterologist to clarify.  If you requested that your care partner not be given the details of your procedure findings, then the procedure report has been included in a sealed envelope for you to review at your convenience later.  YOU SHOULD EXPECT: Some feelings of bloating in the abdomen. Passage of more gas than usual.  Walking can help get rid of the air that was put into your GI tract during the procedure and reduce the bloating. If you had a lower endoscopy (such as a colonoscopy or flexible sigmoidoscopy) you may notice spotting of blood in your stool or on the toilet paper. If you underwent a bowel prep for your procedure, you may not have a normal bowel movement for a few days.  Please Note:  You might notice some irritation and congestion in your nose or some drainage.  This is from the oxygen used during your procedure.  There is no need for concern and it should clear up in a day or so.  SYMPTOMS TO REPORT IMMEDIATELY:  Following lower endoscopy (colonoscopy or flexible sigmoidoscopy):  Excessive amounts of blood in the stool  Significant tenderness or worsening of abdominal pains  Swelling of the abdomen that is new, acute  Fever of 100F or higher.  For urgent or emergent issues, a gastroenterologist can be reached at any hour by calling [phone]. Do not use MyChart messaging for urgent concerns.    DIET:  We do recommend a small meal at first, but then you may proceed to your regular diet.  Drink plenty of fluids but you should avoid alcoholic beverages for 24 hours.  ACTIVITY:  You should  plan to take it easy for the rest of today and you should NOT DRIVE or use heavy machinery until tomorrow (because of the sedation medicines used during the test).    FOLLOW UP: Our staff will call the number listed on your records the next business day following your procedure.  We will call around 7:15- 8:00 am to check on you and address any questions or concerns that you may have regarding the information given to you following your procedure. If we do not reach you, we will leave a message.     If any biopsies were taken you will be contacted by phone or by letter within the next 1-3 weeks.  Please call us at [phone] if you have not heard about the biopsies in 3 weeks.    SIGNATURES/CONFIDENTIALITY: You and/or your care partner have signed paperwork which will be entered into your electronic medical record.  These signatures attest to the fact that that the information above on your After Visit Summary has been reviewed and is understood.  Full responsibility of the confidentiality of this discharge information lies with you and/or your care-partner.No polyps or cancer were seen - normal colonoscopy.  Next routine colonoscopy or other screening test in 10 years - 2034.  I appreciate the opportunity to care for you. Iva Boop, MD, [facility]

## 2023-02-27 ENCOUNTER — Telehealth: Payer: Self-pay | Admitting: *Deleted

## 2023-02-27 NOTE — Telephone Encounter (Signed)
  Follow up Call-     02/26/2023    7:29 AM  Call back number  Post procedure Call Back phone  # [phone]  Permission to leave phone message Yes     Patient questions:  Do you have a fever, pain , or abdominal swelling? No. Pain Score  0 *  Have you tolerated food without any problems? Yes.    Have you been able to return to your normal activities? Yes.    Do you have any questions about your discharge instructions: Diet   No. Medications  No. Follow up visit  No.  Do you have questions or concerns about your Care? No.  Actions: * If pain score is 4 or above: No action needed, pain <4.

## 2023-03-04 ENCOUNTER — Encounter: Payer: BC Managed Care – PPO | Admitting: Gastroenterology

## 2023-03-19 ENCOUNTER — Other Ambulatory Visit: Payer: Self-pay | Admitting: Urology

## 2023-03-19 ENCOUNTER — Other Ambulatory Visit: Payer: Self-pay

## 2023-03-19 ENCOUNTER — Emergency Department (HOSPITAL_BASED_OUTPATIENT_CLINIC_OR_DEPARTMENT_OTHER)
Admission: EM | Admit: 2023-03-19 | Discharge: 2023-03-19 | Disposition: A | Discharge status: Home / Self Care | Payer: BC Managed Care – PPO | Attending: Emergency Medicine | Admitting: Emergency Medicine

## 2023-03-19 ENCOUNTER — Encounter (HOSPITAL_BASED_OUTPATIENT_CLINIC_OR_DEPARTMENT_OTHER): Payer: Self-pay | Admitting: *Deleted

## 2023-03-19 ENCOUNTER — Emergency Department (HOSPITAL_BASED_OUTPATIENT_CLINIC_OR_DEPARTMENT_OTHER): Payer: BC Managed Care – PPO

## 2023-03-19 DIAGNOSIS — N132 Hydronephrosis with renal and ureteral calculous obstruction: Secondary | ICD-10-CM | POA: Insufficient documentation

## 2023-03-19 DIAGNOSIS — E669 Obesity, unspecified: Secondary | ICD-10-CM | POA: Insufficient documentation

## 2023-03-19 DIAGNOSIS — Z6837 Body mass index (BMI) 37.0-37.9, adult: Secondary | ICD-10-CM | POA: Insufficient documentation

## 2023-03-19 DIAGNOSIS — N201 Calculus of ureter: Secondary | ICD-10-CM | POA: Diagnosis not present

## 2023-03-19 DIAGNOSIS — K760 Fatty (change of) liver, not elsewhere classified: Secondary | ICD-10-CM | POA: Diagnosis not present

## 2023-03-19 DIAGNOSIS — R109 Unspecified abdominal pain: Secondary | ICD-10-CM | POA: Diagnosis not present

## 2023-03-19 DIAGNOSIS — N202 Calculus of kidney with calculus of ureter: Secondary | ICD-10-CM | POA: Diagnosis not present

## 2023-03-19 LAB — URINALYSIS, W/ REFLEX TO CULTURE (INFECTION SUSPECTED)
Bilirubin Urine: NEGATIVE
Glucose, UA: NEGATIVE mg/dL
Ketones, ur: NEGATIVE mg/dL
Leukocytes,Ua: NEGATIVE
Nitrite: NEGATIVE
Protein, ur: 30 mg/dL — AB
Specific Gravity, Urine: 1.04 — ABNORMAL HIGH (ref 1.005–1.030)
pH: 5.5 (ref 5.0–8.0)

## 2023-03-19 LAB — BASIC METABOLIC PANEL
Anion gap: 10 (ref 5–15)
BUN: 17 mg/dL (ref 6–20)
CO2: 27 mmol/L (ref 22–32)
Calcium: 9.4 mg/dL (ref 8.9–10.3)
Chloride: 103 mmol/L (ref 98–111)
Creatinine, Ser: 1.04 mg/dL (ref 0.61–1.24)
GFR, Estimated: >60 mL/min (ref >60)
Glucose, Bld: 114 mg/dL — ABNORMAL HIGH (ref 70–99)
Potassium: 3.4 mmol/L — ABNORMAL LOW (ref 3.5–5.1)
Sodium: 140 mmol/L (ref 135–145)

## 2023-03-19 LAB — CBC
HCT: 42.7 % (ref 39.0–52.0)
Hemoglobin: 15.1 g/dL (ref 13.0–17.0)
MCH: 30 pg (ref 26.0–34.0)
MCHC: 35.4 g/dL (ref 30.0–36.0)
MCV: 84.9 fL (ref 80.0–100.0)
Platelets: 208 10*3/uL (ref 150–400)
RBC: 5.03 MIL/uL (ref 4.22–5.81)
RDW: 12.9 % (ref 11.5–15.5)
WBC: 8.3 10*3/uL (ref 4.0–10.5)
nRBC: 0 % (ref 0.0–0.2)

## 2023-03-19 MED ORDER — KETOROLAC TROMETHAMINE 10 MG PO TABS: 10.0000 mg | ORAL_TABLET | Freq: Four times a day (QID) | ORAL | 0 refills | Status: AC | PRN

## 2023-03-19 MED ORDER — KETOROLAC TROMETHAMINE 60 MG/2ML IM SOLN
30.0000 mg | Freq: Once | INTRAMUSCULAR | Status: AC
  Administered 2023-03-19: 30 mg via INTRAMUSCULAR
  Filled 2023-03-19: qty 2

## 2023-03-19 MED ORDER — OXYCODONE HCL 5 MG PO TABS
2.5000 mg | ORAL_TABLET | Freq: Four times a day (QID) | ORAL | 0 refills | Status: AC | PRN
Start: 2023-03-19 — End: 2023-03-24

## 2023-03-19 MED ORDER — ONDANSETRON 4 MG PO TBDP: 4.0000 mg | ORAL_TABLET | Freq: Three times a day (TID) | ORAL | 0 refills | Status: AC | PRN

## 2023-03-19 MED ORDER — ONDANSETRON 4 MG PO TBDP
4.0000 mg | ORAL_TABLET | Freq: Once | ORAL | Status: AC
  Administered 2023-03-19: 4 mg via ORAL
  Filled 2023-03-19: qty 1

## 2023-03-19 MED ORDER — OXYCODONE-ACETAMINOPHEN 5-325 MG PO TABS
1.0000 | ORAL_TABLET | ORAL | Status: DC | PRN
  Administered 2023-03-19: 1 via ORAL
  Filled 2023-03-19: qty 1

## 2023-03-19 MED ORDER — TAMSULOSIN HCL 0.4 MG PO CAPS: 0.4000 mg | ORAL_CAPSULE | Freq: Every day | ORAL | 0 refills | Status: AC

## 2023-03-19 MED ORDER — TAMSULOSIN HCL 0.4 MG PO CAPS
0.4000 mg | ORAL_CAPSULE | Freq: Once | ORAL | Status: AC
  Administered 2023-03-19: 0.4 mg via ORAL
  Filled 2023-03-19: qty 1

## 2023-03-19 NOTE — ED Notes (Signed)
ED Provider at bedside. 

## 2023-03-19 NOTE — ED Triage Notes (Signed)
Pt arrives, reporting about 1.5 hours ago he had onset of left flank pain "feels like gas". Vomited x 1, denies difficulty urinating.

## 2023-03-19 NOTE — ED Provider Notes (Signed)
 [place] EMERGENCY DEPARTMENT AT [facility] Provider Note  CSN: [id number] Arrival date & time: 03/19/23 0204  Chief Complaint(s) Flank Pain  HPI Steven Hudson is a 52 y.o. male here with several hours of left flank pain.  Fluctuating nature.  Radiating to the groin.  Associated with nausea without emesis.  No urinary symptoms.  No alleviating or aggravating factors.  No prior history of stones.  No other physical complaints   Flank Pain    Past Medical History Past Medical History:  Diagnosis Date   Clotting disorder [facility])    Patient Active Problem List   Diagnosis Date Noted   Melanocytic nevi of trunk 01/23/2023   SK (seborrheic keratosis) 01/23/2023   Solar lentigo 01/23/2023   Right leg DVT (HCC) 02/10/2020   Obesity, unspecified 05/22/2012   Adult ADHD 05/22/2012   Home Medication(s) Prior to Admission medications   Medication Sig Start Date End Date Taking? Authorizing Provider  ketorolac  (TORADOL ) 10 MG tablet Take 1 tablet (10 mg total) by mouth every 6 (six) hours as needed. 03/19/23  Yes Shahir Karen, Raynell Moder, MD  ondansetron  (ZOFRAN -ODT) 4 MG disintegrating tablet Take 1 tablet (4 mg total) by mouth every 8 (eight) hours as needed for up to 3 days for nausea or vomiting. 03/19/23 03/22/23 Yes Ebelin Dillehay, Raynell Moder, MD  oxyCODONE  (ROXICODONE ) 5 MG immediate release tablet Take 0.5-1 tablets (2.5-5 mg total) by mouth every 6 (six) hours as needed for up to 5 days for severe pain (pain score 7-10). 03/19/23 03/24/23 Yes Othmar Ringer, Raynell Moder, MD  tamsulosin  (FLOMAX ) 0.4 MG CAPS capsule Take 1 capsule (0.4 mg total) by mouth daily for 5 days. 03/19/23 03/24/23 Yes Laruth Hanger, Raynell Moder, MD                                                                                                                                    Allergies Patient has no known allergies.  Review of Systems Review of Systems  Genitourinary:  Positive for flank pain.   As noted in  HPI  Physical Exam Vital Signs  I have reviewed the triage vital signs BP (!) 162/94 (BP Location: Right Arm)   Pulse (!) 50   Temp 98 F (36.7 C) (Oral)   Resp 18   Ht 6' (1.829 m)   Wt 127 kg   SpO2 97%   BMI 37.97 kg/m   Physical Exam Vitals reviewed.  Constitutional:      General: He is not in acute distress.    Appearance: He is well-developed. He is obese. He is not diaphoretic.  HENT:     Head: Normocephalic and atraumatic.     Right Ear: External ear normal.     Left Ear: External ear normal.     Nose: Nose normal.     Mouth/Throat:     Mouth: Mucous membranes are moist.  Eyes:     General: No scleral  icterus.    Conjunctiva/sclera: Conjunctivae normal.  Neck:     Trachea: Phonation normal.  Cardiovascular:     Rate and Rhythm: Normal rate and regular rhythm.  Pulmonary:     Effort: Pulmonary effort is normal. No respiratory distress.     Breath sounds: No stridor.  Abdominal:     General: There is no distension.     Tenderness: There is no abdominal tenderness. There is no guarding or rebound.  Musculoskeletal:        General: Normal range of motion.     Cervical back: Normal range of motion.  Neurological:     Mental Status: He is alert and oriented to person, place, and time.  Psychiatric:        Behavior: Behavior normal.     ED Results and Treatments Labs (all labs ordered are listed, but only abnormal results are displayed) Labs Reviewed  BASIC METABOLIC PANEL - Abnormal; Notable for the following components:      Result Value   Potassium 3.4 (*)    Glucose, Bld 114 (*)    All other components within normal limits  URINALYSIS, W/ REFLEX TO CULTURE (INFECTION SUSPECTED) - Abnormal; Notable for the following components:   APPearance HAZY (*)    Specific Gravity, Urine 1.040 (*)    Hgb urine dipstick LARGE (*)    Protein, ur 30 (*)    Bacteria, UA FEW (*)    All other components within normal limits  CBC                                                                                                                          EKG  EKG Interpretation Date/Time:    Ventricular Rate:    PR Interval:    QRS Duration:    QT Interval:    QTC Calculation:   R Axis:      Text Interpretation:         Radiology CT Renal Stone Study Result Date: 03/19/2023 CLINICAL DATA:  Abdominal and flank pain.  Vomiting. EXAM: CT ABDOMEN AND PELVIS WITHOUT CONTRAST TECHNIQUE: Multidetector CT imaging of the abdomen and pelvis was performed following the standard protocol without IV contrast. RADIATION DOSE REDUCTION: This exam was performed according to the departmental dose-optimization program which includes automated exposure control, adjustment of the mA and/or kV according to patient size and/or use of iterative reconstruction technique. COMPARISON:  None Available. FINDINGS: Lower chest: No acute findings. Hepatobiliary: The liver shows diffusely decreased attenuation suggesting fat deposition. No suspicious focal abnormality in the liver on this study without intravenous contrast. There is no evidence for gallstones, gallbladder wall thickening, or pericholecystic fluid. No intrahepatic or extrahepatic biliary dilation. Pancreas: No focal mass lesion. No dilatation of the main duct. No intraparenchymal cyst. No peripancreatic edema. Spleen: No splenomegaly. No suspicious focal mass lesion. Adrenals/Urinary Tract: No adrenal nodule or mass. Right kidney and ureter unremarkable, without stone disease. Mild fullness noted left intrarenal collecting system with  8 x 5 x 8 mm stone at the left UPJ (42/2 and coronal 56/5). No other stones in the left kidney or ureter. No bladder stones. Stomach/Bowel: Stomach is moderately distended with fluid. Duodenum is normally positioned as is the ligament of Treitz. No small bowel wall thickening. No small bowel dilatation. The terminal ileum is normal. The appendix is normal. No gross colonic mass. No colonic wall  thickening. Vascular/Lymphatic: No abdominal aortic aneurysm. No abdominal aortic atherosclerotic calcification. There is no gastrohepatic or hepatoduodenal ligament lymphadenopathy. No retroperitoneal or mesenteric lymphadenopathy. No pelvic sidewall lymphadenopathy. Reproductive: The prostate gland and seminal vesicles are unremarkable. Other: No intraperitoneal free fluid. Musculoskeletal: No worrisome lytic or sclerotic osseous abnormality. IMPRESSION: 1. 8 x 5 x 8 mm stone at the left UPJ with mild fullness of the left intrarenal collecting system. No other urinary stone disease evident. 2. Hepatic steatosis. Electronically Signed   By: Camellia Candle M.D.   On: 03/19/2023 05:07    Medications Ordered in ED Medications  oxyCODONE -acetaminophen  (PERCOCET/ROXICET) 5-325 MG per tablet 1 tablet (1 tablet Oral Given 03/19/23 0217)  ondansetron  (ZOFRAN -ODT) disintegrating tablet 4 mg (4 mg Oral Given 03/19/23 0218)  ketorolac  (TORADOL ) injection 30 mg (30 mg Intramuscular Given 03/19/23 0253)  tamsulosin  (FLOMAX ) capsule 0.4 mg (0.4 mg Oral Given 03/19/23 0608)  ketorolac  (TORADOL ) injection 30 mg (30 mg Intramuscular Given 03/19/23 0608)   Procedures Procedures  (including critical care time) Medical Decision Making / ED Course   Medical Decision Making Amount and/or Complexity of Data Reviewed Labs: ordered. Decision-making details documented in ED Course. Radiology: ordered and independent interpretation performed. Decision-making details documented in ED Course.  Risk Prescription drug management. Risk Details: For pain control please take your prescribed Toradol  and 1000 mg of Tylenol  every 8 hours scheduled.  In addition you can take 0.5 to 1 tablet of Oxycodone  every 6 hours as needed for pain not controlled with the other medications.     Left flank pain.  Workup is consistent with left renal stone in the UPJ.  Approximately 8 mm stone.  UA without evidence of infection.  No AKI.   Labs reassuring.  Pain control with IM Toradol .  Expectant management with close urology follow-up.    Final Clinical Impression(s) / ED Diagnoses Final diagnoses:  Hydronephrosis with urinary obstruction due to ureteral calculus   The patient appears reasonably screened and/or stabilized for discharge and I doubt any other medical condition or other [facility] requiring further screening, evaluation, or treatment in the ED at this time. I have discussed the findings, Dx and Tx plan with the patient/family who expressed understanding and agree(s) with the plan. Discharge instructions discussed at length. The patient/family was given strict return precautions who verbalized understanding of the instructions. No further questions at time of discharge.  Disposition: Discharge  Condition: Good  ED Discharge Orders          Ordered    tamsulosin  (FLOMAX ) 0.4 MG CAPS capsule  Daily        03/19/23 0624    ketorolac  (TORADOL ) 10 MG tablet  Every 6 hours PRN        03/19/23 0624    oxyCODONE  (ROXICODONE ) 5 MG immediate release tablet  Every 6 hours PRN        03/19/23 0624    ondansetron  (ZOFRAN -ODT) 4 MG disintegrating tablet  Every 8 hours PRN        03/19/23 0624            [employer]  [place] narcotic database reviewed and no active prescriptions noted.   Follow Up: Watt Rush, MD [address] [place] [address] [phone]  Call  to schedule an appointment for close follow up    This chart was dictated using voice recognition software.  Despite best efforts to proofread,  errors can occur which can change the documentation meaning.    Trine Raynell Moder, MD 03/19/23 [phone]

## 2023-03-21 ENCOUNTER — Encounter (HOSPITAL_BASED_OUTPATIENT_CLINIC_OR_DEPARTMENT_OTHER): Payer: Self-pay | Admitting: Urology

## 2023-03-21 NOTE — Progress Notes (Signed)
 Spoke w/ via phone for pre-op interview--- [facility] needs dos----              n/a COVID test -----patient states asymptomatic no test needed Arrive at ------- 0800 NPO after MN NO Solid Food.  Clear liquids from MN until--- sip of water with meds only Med rec completed. Pt aware to hold ASA/NSAIDs and supplements per [facility] protocol.  Medications to take morning of surgery ----- tylenol  prn Diabetic/Weight loss medication ----- n/a No Alcohol or recreational drugs for 24 hours/Tobacco products for 6 hours ---- pt aware Patient instructed to bring blue lithotripsy folder, photo id and insurance card day of surgery. Patient aware to have Driver (ride ) / caregiver for 24 hours after surgery ----- Powell (spouse) Patient Special Instructions ----- n/a Pre-Op special Instructions ----- take laxative of choice day before procedure Patient verbalized understanding of instructions that were given at this phone interview. Patient denies shortness of breath, chest pain, fever, cough at this phone interview.

## 2023-03-22 ENCOUNTER — Ambulatory Visit (HOSPITAL_BASED_OUTPATIENT_CLINIC_OR_DEPARTMENT_OTHER)
Admission: RE | Admit: 2023-03-22 | Discharge: 2023-03-22 | Disposition: A | Discharge status: Home / Self Care | Payer: BC Managed Care – PPO | Attending: Urology | Admitting: Urology

## 2023-03-22 ENCOUNTER — Encounter (HOSPITAL_BASED_OUTPATIENT_CLINIC_OR_DEPARTMENT_OTHER)
Admission: RE | Disposition: A | Discharge status: Home / Self Care | Payer: Self-pay | Source: Home / Self Care | Attending: Urology

## 2023-03-22 ENCOUNTER — Ambulatory Visit (HOSPITAL_COMMUNITY): Payer: BC Managed Care – PPO

## 2023-03-22 ENCOUNTER — Encounter (HOSPITAL_BASED_OUTPATIENT_CLINIC_OR_DEPARTMENT_OTHER): Payer: Self-pay | Admitting: Urology

## 2023-03-22 DIAGNOSIS — Z6838 Body mass index (BMI) 38.0-38.9, adult: Secondary | ICD-10-CM | POA: Diagnosis not present

## 2023-03-22 DIAGNOSIS — Z86718 Personal history of other venous thrombosis and embolism: Secondary | ICD-10-CM | POA: Insufficient documentation

## 2023-03-22 DIAGNOSIS — N201 Calculus of ureter: Secondary | ICD-10-CM | POA: Diagnosis not present

## 2023-03-22 DIAGNOSIS — E669 Obesity, unspecified: Secondary | ICD-10-CM | POA: Diagnosis not present

## 2023-03-22 HISTORY — DX: Personal history of urinary calculi: Z87.442

## 2023-03-22 HISTORY — PX: EXTRACORPOREAL SHOCK WAVE LITHOTRIPSY: SHX1557

## 2023-03-22 SURGERY — LITHOTRIPSY, ESWL
Anesthesia: LOCAL | Laterality: Left

## 2023-03-22 MED ORDER — SODIUM CHLORIDE 0.9 % IV SOLN: INTRAVENOUS | Status: DC

## 2023-03-22 MED ORDER — DIAZEPAM 5 MG PO TABS
10.0000 mg | ORAL_TABLET | ORAL | Status: AC
  Administered 2023-03-22: 10 mg via ORAL

## 2023-03-22 MED ORDER — DIAZEPAM 5 MG PO TABS
ORAL_TABLET | ORAL | Status: AC
Start: 2023-03-22 — End: ?
  Filled 2023-03-22: qty 2

## 2023-03-22 MED ORDER — CIPROFLOXACIN HCL 500 MG PO TABS
ORAL_TABLET | ORAL | Status: AC
Start: 2023-03-22 — End: ?
  Filled 2023-03-22: qty 1

## 2023-03-22 MED ORDER — DIPHENHYDRAMINE HCL 25 MG PO CAPS
ORAL_CAPSULE | ORAL | Status: AC
Start: 2023-03-22 — End: ?
  Filled 2023-03-22: qty 1

## 2023-03-22 MED ORDER — CIPROFLOXACIN HCL 500 MG PO TABS
500.0000 mg | ORAL_TABLET | ORAL | Status: AC
  Administered 2023-03-22: 500 mg via ORAL

## 2023-03-22 MED ORDER — DIPHENHYDRAMINE HCL 25 MG PO CAPS
25.0000 mg | ORAL_CAPSULE | ORAL | Status: AC
  Administered 2023-03-22: 25 mg via ORAL

## 2023-03-22 NOTE — H&P (Signed)
 Steven Hudson is an 53 y.o. male.    Chief Complaint: Pre-OP LEFT Shockwave Lithotripsy  HPI:   1 - LEFT Proximal Ureteral Stone - 8mm left prox stone by ER CT and subsequent KUB late 02/2023. Stone is solitary at L1-L2 interspace. UA without infectious parameters.  Today Steven Hudson is seen to proceed with LEFT shockwave lithotripsy. No interval fevers.   Past Medical History:  Diagnosis Date   Clotting disorder (HCC)    DVT in RLE several years ago, no blood thinners as of 03/2023   History of kidney stones     Past Surgical History:  Procedure Laterality Date   EYE SURGERY  1996    Family History  Problem Relation Age of Onset   Hypertension Mother    Leukemia Mother    Cancer Mother    Diabetes Father    Hypertension Brother    Rectal cancer Paternal Uncle    Heart disease Maternal Grandmother    Heart disease Maternal Grandfather    Diabetes Paternal Grandfather    Colon polyps Neg Hx    Colon cancer Neg Hx    Esophageal cancer Neg Hx    Stomach cancer Neg Hx    Social History:  reports that he has never smoked. He has quit using smokeless tobacco.  His smokeless tobacco use included chew. He reports current alcohol use. He reports that he does not use drugs.  Allergies: No Known Allergies  No medications prior to admission.    No results found for this or any previous visit (from the past 48 hours). No results found.  Review of Systems  Constitutional:  Negative for chills and fever.  Genitourinary:  Positive for flank pain.  All other systems reviewed and are negative.   There were no vitals taken for this visit. Physical Exam Vitals reviewed.  HENT:     Head: Normocephalic.  Eyes:     Pupils: Pupils are equal, round, and reactive to light.  Cardiovascular:     Rate and Rhythm: Normal rate.  Abdominal:     General: Abdomen is flat.     Comments: Moderate truncal obesity  Genitourinary:    Comments: Mild left CVAT at present Musculoskeletal:         General: Normal range of motion.     Cervical back: Normal range of motion.  Skin:    General: Skin is warm.  Neurological:     General: No focal deficit present.     Mental Status: He is alert.  Psychiatric:        Mood and Affect: Mood normal.      Assessment/Plan  Proceed as planned with LEFT shockwave lithotripsy. Risks,  benefits, alternatives, expected peri-treatemnt course discussed previously and reiterated today.  Ricardo KATHEE Steven Hudson., MD 03/22/2023, 7:04 AM

## 2023-03-22 NOTE — Discharge Instructions (Addendum)
1 - You may have urinary urgency (bladder spasms), pass small stone fragments, and bloody urine on / off for up to 2 weeks. This is normal.  2 - Call MD or go to ER for fever >102, severe pain / nausea / vomiting not relieved by medications, or acute change in medical status     Post Anesthesia Home Care Instructions  Activity: Get plenty of rest for the remainder of the day. A responsible individual must stay with you for 24 hours following the procedure.  For the next 24 hours, DO NOT: -Drive a car -Paediatric nurse -Drink alcoholic beverages -Take any medication unless instructed by your physician -Make any legal decisions or sign important papers.  Meals: Start with liquid foods such as gelatin or soup. Progress to regular foods as tolerated. Avoid greasy, spicy, heavy foods. If nausea and/or vomiting occur, drink only clear liquids until the nausea and/or vomiting subsides. Call your physician if vomiting continues.  Special Instructions/Symptoms: Your throat may feel dry or sore from the anesthesia or the breathing tube placed in your throat during surgery. If this causes discomfort, gargle with warm salt water. The discomfort should disappear within 24 hours.

## 2023-03-22 NOTE — Brief Op Note (Signed)
 03/22/2023  10:07 AM  PATIENT:  Steven Hudson  53 y.o. male  PRE-OPERATIVE DIAGNOSIS:  LEFT URETERAL STONE  POST-OPERATIVE DIAGNOSIS:  * No post-op diagnosis entered *  PROCEDURE:  Procedure(s) with comments: LEFT EXTRACORPOREAL SHOCK WAVE LITHOTRIPSY (ESWL) (Left) - 75 MINUTE CASE  SURGEON:  Surgeons and Role:    * Manny, Ricardo KATHEE Raddle., MD - Primary  PHYSICIAN ASSISTANT:   ASSISTANTS: none   ANESTHESIA:   MAC  EBL:  minimal   BLOOD ADMINISTERED:none  DRAINS: none   LOCAL MEDICATIONS USED:  NONE  SPECIMEN:  No Specimen  DISPOSITION OF SPECIMEN:  N/A  COUNTS:  YES  TOURNIQUET:  * No tourniquets in log *  DICTATION: .Note written in paper chart  PLAN OF CARE: Discharge to home after PACU  PATIENT DISPOSITION:  Short Stay   Delay start of Pharmacological VTE agent (>24hrs) due to surgical blood loss or risk of bleeding: yes

## 2023-03-25 ENCOUNTER — Encounter (HOSPITAL_BASED_OUTPATIENT_CLINIC_OR_DEPARTMENT_OTHER): Payer: Self-pay | Admitting: Urology

## 2023-04-05 DIAGNOSIS — N201 Calculus of ureter: Secondary | ICD-10-CM | POA: Diagnosis not present

## 2023-04-05 DIAGNOSIS — R8271 Bacteriuria: Secondary | ICD-10-CM | POA: Diagnosis not present

## 2023-04-25 DIAGNOSIS — N201 Calculus of ureter: Secondary | ICD-10-CM | POA: Diagnosis not present

## 2023-05-17 DIAGNOSIS — N201 Calculus of ureter: Secondary | ICD-10-CM | POA: Diagnosis not present

## 2023-08-19 ENCOUNTER — Telehealth

## 2023-08-19 DIAGNOSIS — L304 Erythema intertrigo: Secondary | ICD-10-CM

## 2023-08-19 MED ORDER — NYSTATIN-TRIAMCINOLONE 100000-0.1 UNIT/GM-% EX OINT: 1.0000 | TOPICAL_OINTMENT | Freq: Two times a day (BID) | CUTANEOUS | 0 refills | Status: AC

## 2023-08-19 MED ORDER — FLUCONAZOLE 150 MG PO TABS: 150.0000 mg | ORAL_TABLET | ORAL | 0 refills | Status: AC

## 2023-08-19 NOTE — Patient Instructions (Signed)
 Steven Hudson, thank you for joining Angelia Kelp, PA-C for today's virtual visit.  While this provider is not your primary care provider (PCP), if your PCP is located in our provider database this encounter information will be shared with them immediately following your visit.   A [facility] MyChart account gives you access to today's visit and all your visits, tests, and labs performed at [facility] " click here if you don't have a [facility] MyChart account or go to [email or web address]  Consent: (Patient) Steven Hudson provided verbal consent for this virtual visit at the beginning of the encounter.  Current Medications:  Current Outpatient Medications:    fluconazole  (DIFLUCAN ) 150 MG tablet, Take 1 tablet (150 mg total) by mouth once a week., Disp: 3 tablet, Rfl: 0   nystatin -triamcinolone  ointment (MYCOLOG), Apply 1 Application topically 2 (two) times daily., Disp: 60 g, Rfl: 0   acetaminophen  (TYLENOL ) 500 MG tablet, Take 500 mg by mouth every 6 (six) hours as needed for moderate pain (pain score 4-6)., Disp: , Rfl:    ketorolac  (TORADOL ) 10 MG tablet, Take 1 tablet (10 mg total) by mouth every 6 (six) hours as needed., Disp: 20 tablet, Rfl: 0   Medications ordered in this encounter:  Meds ordered this encounter  Medications   fluconazole  (DIFLUCAN ) 150 MG tablet    Sig: Take 1 tablet (150 mg total) by mouth once a week.    Dispense:  3 tablet    Refill:  0    Supervising Provider:   LAMPTEY, PHILIP O [[id number]]   nystatin -triamcinolone  ointment (MYCOLOG)    Sig: Apply 1 Application topically 2 (two) times daily.    Dispense:  60 g    Refill:  0    Supervising Provider:   Corine Dice [[id number]]     *If you need refills on other medications prior to your next appointment, please contact your pharmacy*  Follow-Up: Call back or seek an in-person evaluation if the symptoms worsen or if the condition fails to improve as anticipated.  [facility] [phone]  Other Instructions Intertrigo [name] is skin irritation or inflammation (dermatitis) that occurs when folds of skin rub together. The irritation can cause a rash and make skin raw and itchy. This condition mostly occurs in the skin folds of these areas: Armpits. Under the breasts. Under the abdomen. Groin. Buttocks. Toes. Intertrigo is not passed from person to person (is not contagious). What are the causes? This condition is caused by heat, moisture, rubbing (friction), and not enough air circulation. The condition can be made worse by: Sweat. Bacteria. A fungus, such as yeast. What increases the risk? This condition is more likely to occur if you have moisture in your skin folds. You are more likely to develop this condition if you: Are not able to move around or are not active. Live in a warm and moist climate. Are not able to control your bowels or bladder (have incontinence). Wear splints, braces, or other medical devices. Are overweight. Have diabetes. What are the signs or symptoms? Symptoms of this condition include: A pink or red skin rash in a skin fold or near a skin fold. Raw or scaly skin. Itchiness or burning. Bleeding. Leaking fluid. A bad smell. How is this diagnosed? This condition is diagnosed with a medical history and physical exam. You may also have a skin swab to test for bacteria or fungus. How is this treated? This condition may be treated by:  Cleaning and drying your skin. Taking an antibiotic medicine or using an antibiotic skin cream for a bacterial infection. Using an antifungal cream on your skin or taking pills for an infection that was caused by a fungus, such as yeast. Using a steroid ointment to relieve itchiness and irritation. Separating the skin fold with a clean cotton cloth to absorb moisture and allow air to flow into the area. Follow these instructions at home: Keep the affected area clean and  dry. Do not scratch your skin. Stay in a cool environment as much as possible. Use an air conditioner or fan, if available. Apply over-the-counter and prescription medicines only as told by your health care provider. If you were prescribed antibiotics, use them as told by your health care provider. Do not stop using the antibiotic even if you start to feel better. Keep all follow-up visits. Your health care provider may need to check how well your skin is responding to the treatment. How is this prevented? Shower and dry yourself well after activity or exercise. Use a hair dryer on a cool setting to dry between skin folds, especially after you bathe. Do not wear tight clothes. Wear clothes that are loose, absorbent, and made of cotton. Wear a bra that gives good support, if needed. Protect the skin around your groin and buttocks, especially if you have incontinence. Skin protection includes: Following a regular cleaning routine. Using skin protectant creams, powders, or ointments. Changing protection pads frequently. Maintain a healthy weight. Take care of your feet, especially if you have diabetes. Foot care includes: Wearing shoes that fit well. Keeping your feet dry. Wearing clean, breathable socks. If you have diabetes, keep your blood sugar under control. Contact a health care provider if: Your symptoms do not improve with treatment. Your symptoms get worse or they spread. You notice increased redness and warmth. You have a fever. This information is not intended to replace advice given to you by your health care provider. Make sure you discuss any questions you have with your health care provider. Document Revised: 07/27/2021 Document Reviewed: 07/27/2021 Elsevier Patient Education  2024 [employer]   If you have been instructed to have an in-person evaluation today at a local Urgent Care facility, please use the link below. It will take you to a list of all of our available  [facility], including address, phone number and hours of operation. Please do not delay care.  [facility]  If you or a family member do not have a primary care provider, use the link below to schedule a visit and establish care. When you choose a [facility] primary care physician or advanced practice provider, you gain a long-term partner in health. Find a Primary Care Provider  Learn more about [facility]'s in-office and virtual care options:  - Get Care Now

## 2023-08-19 NOTE — Progress Notes (Signed)
 Virtual Visit Consent   Steven Hudson, you are scheduled for a virtual visit with a [facility] provider today. Just as with appointments in the office, your consent must be obtained to participate. Your consent will be active for this visit and any virtual visit you may have with one of our providers in the next 365 days. If you have a MyChart account, a copy of this consent can be sent to you electronically.  As this is a virtual visit, video technology does not allow for your provider to perform a traditional examination. This may limit your provider's ability to fully assess your condition. If your provider identifies any concerns that need to be evaluated in person or the need to arrange testing (such as labs, EKG, etc.), we will make arrangements to do so. Although advances in technology are sophisticated, we cannot ensure that it will always work on either your end or our end. If the connection with a video visit is poor, the visit may have to be switched to a telephone visit. With either a video or telephone visit, we are not always able to ensure that we have a secure connection.  By engaging in this virtual visit, you consent to the provision of healthcare and authorize for your insurance to be billed (if applicable) for the services provided during this visit. Depending on your insurance coverage, you may receive a charge related to this service.  I need to obtain your verbal consent now. Are you willing to proceed with your visit today? [name] has provided verbal consent on 08/19/2023 for a virtual visit (video or telephone). Angelia Kelp, PA-C  Date: 08/19/2023 8:36 AM   Virtual Visit via Video Note   I, Angelia Kelp, connected with  Steven Hudson  ([id number], 05/15/1970) on 08/19/23 at  8:30 AM EDT by a video-enabled telemedicine application and verified that I am speaking with the correct person using two identifiers.  Location: Patient: Virtual Visit Location  Patient: Home Provider: Virtual Visit Location Provider: Home Office   I discussed the limitations of evaluation and management by telemedicine and the availability of in person appointments. The patient expressed understanding and agreed to proceed.    History of Present Illness: Steven Hudson is a 53 y.o. who identifies as a male who was assigned male at birth, and is being seen today for rash.  HPI: Rash This is a recurrent problem. The current episode started 1 to 4 weeks ago. The problem has been gradually worsening since onset. The affected locations include the left axilla and right axilla. The rash is characterized by itchiness, redness and burning. He was exposed to nothing. Pertinent negatives include no congestion, cough, facial edema, fatigue, fever or shortness of breath. Past treatments include moisturizer. The treatment provided no relief.     Problems:  Patient Active Problem List   Diagnosis Date Noted   Melanocytic nevi of trunk 01/23/2023   SK (seborrheic keratosis) 01/23/2023   Solar lentigo 01/23/2023   Right leg DVT (HCC) 02/10/2020   Obesity, unspecified 05/22/2012   Adult ADHD 05/22/2012    Allergies: No Known Allergies Medications:  Current Outpatient Medications:    fluconazole  (DIFLUCAN ) 150 MG tablet, Take 1 tablet (150 mg total) by mouth once a week., Disp: 3 tablet, Rfl: 0   nystatin -triamcinolone  ointment (MYCOLOG), Apply 1 Application topically 2 (two) times daily., Disp: 60 g, Rfl: 0   acetaminophen  (TYLENOL ) 500 MG tablet, Take 500 mg by mouth every 6 (  six) hours as needed for moderate pain (pain score 4-6)., Disp: , Rfl:    ketorolac  (TORADOL ) 10 MG tablet, Take 1 tablet (10 mg total) by mouth every 6 (six) hours as needed., Disp: 20 tablet, Rfl: 0  Observations/Objective: Patient is well-developed, well-nourished in no acute distress.  Resting comfortably at home.  Head is normocephalic, atraumatic.  No labored breathing. Speech is clear and  coherent with logical content.  Patient is alert and oriented at baseline.    Assessment and Plan: 1. Intertrigo (Primary) - fluconazole  (DIFLUCAN ) 150 MG tablet; Take 1 tablet (150 mg total) by mouth once a week.  Dispense: 3 tablet; Refill: 0 - nystatin -triamcinolone  ointment (MYCOLOG); Apply 1 Application topically 2 (two) times daily.  Dispense: 60 g; Refill: 0  - Suspected intertrigo - Fluconazole  and Mycolog prescribed - Keep skin clean and dry - Seek further evaluation if worsening  Follow Up Instructions: I discussed the assessment and treatment plan with the patient. The patient was provided an opportunity to ask questions and all were answered. The patient agreed with the plan and demonstrated an understanding of the instructions.  A copy of instructions were sent to the patient via MyChart unless otherwise noted below.    The patient was advised to call back or seek an in-person evaluation if the symptoms worsen or if the condition fails to improve as anticipated.    Angelia Kelp, PA-C

## 2024-03-06 DIAGNOSIS — N201 Calculus of ureter: Secondary | ICD-10-CM | POA: Diagnosis not present

## 2024-03-06 DIAGNOSIS — R399 Unspecified symptoms and signs involving the genitourinary system: Secondary | ICD-10-CM | POA: Diagnosis not present
